# Patient Record
Sex: Female | Born: 1980
Health system: Southern US, Community
[De-identification: ages and names within clinical notes are randomized; demographics above are authoritative.]

## PROBLEM LIST (undated history)

## (undated) ENCOUNTER — Inpatient Hospital Stay (HOSPITAL_COMMUNITY): Payer: Self-pay

## (undated) DIAGNOSIS — O10919 Unspecified pre-existing hypertension complicating pregnancy, unspecified trimester: Secondary | ICD-10-CM

## (undated) DIAGNOSIS — T4145XA Adverse effect of unspecified anesthetic, initial encounter: Secondary | ICD-10-CM

## (undated) DIAGNOSIS — T8859XA Other complications of anesthesia, initial encounter: Secondary | ICD-10-CM

## (undated) DIAGNOSIS — J309 Allergic rhinitis, unspecified: Secondary | ICD-10-CM

## (undated) DIAGNOSIS — D219 Benign neoplasm of connective and other soft tissue, unspecified: Secondary | ICD-10-CM

## (undated) DIAGNOSIS — IMO0002 Reserved for concepts with insufficient information to code with codable children: Secondary | ICD-10-CM

## (undated) DIAGNOSIS — F419 Anxiety disorder, unspecified: Secondary | ICD-10-CM

## (undated) HISTORY — DX: Other complications of anesthesia, initial encounter: T88.59XA

## (undated) HISTORY — DX: Adverse effect of unspecified anesthetic, initial encounter: T41.45XA

## (undated) HISTORY — DX: Benign neoplasm of connective and other soft tissue, unspecified: D21.9

## (undated) HISTORY — DX: Reserved for concepts with insufficient information to code with codable children: IMO0002

## (undated) HISTORY — DX: Allergic rhinitis, unspecified: J30.9

---

## 2002-07-16 HISTORY — PX: KNEE SURGERY: SHX244

## 2012-12-23 DIAGNOSIS — Z6837 Body mass index (BMI) 37.0-37.9, adult: Secondary | ICD-10-CM | POA: Insufficient documentation

## 2014-03-19 ENCOUNTER — Emergency Department (HOSPITAL_COMMUNITY)
Admission: EM | Admit: 2014-03-19 | Discharge: 2014-03-19 | Disposition: A | Discharge status: Home / Self Care | Payer: Managed Care, Other (non HMO) | Attending: Emergency Medicine | Admitting: Emergency Medicine

## 2014-03-19 ENCOUNTER — Emergency Department (HOSPITAL_COMMUNITY): Payer: Managed Care, Other (non HMO)

## 2014-03-19 ENCOUNTER — Encounter (HOSPITAL_COMMUNITY): Payer: Self-pay | Admitting: Emergency Medicine

## 2014-03-19 DIAGNOSIS — R109 Unspecified abdominal pain: Secondary | ICD-10-CM

## 2014-03-19 DIAGNOSIS — Z8719 Personal history of other diseases of the digestive system: Secondary | ICD-10-CM | POA: Insufficient documentation

## 2014-03-19 DIAGNOSIS — R1012 Left upper quadrant pain: Secondary | ICD-10-CM | POA: Insufficient documentation

## 2014-03-19 DIAGNOSIS — Z3202 Encounter for pregnancy test, result negative: Secondary | ICD-10-CM | POA: Insufficient documentation

## 2014-03-19 DIAGNOSIS — Z8659 Personal history of other mental and behavioral disorders: Secondary | ICD-10-CM | POA: Insufficient documentation

## 2014-03-19 DIAGNOSIS — R197 Diarrhea, unspecified: Secondary | ICD-10-CM | POA: Insufficient documentation

## 2014-03-19 DIAGNOSIS — R002 Palpitations: Secondary | ICD-10-CM | POA: Insufficient documentation

## 2014-03-19 HISTORY — DX: Anxiety disorder, unspecified: F41.9

## 2014-03-19 LAB — CBC WITH DIFFERENTIAL/PLATELET
BASOS ABS: 0 10*3/uL (ref 0.0–0.1)
Basophils Relative: 0 % (ref 0–1)
Eosinophils Absolute: 0.1 10*3/uL (ref 0.0–0.7)
Eosinophils Relative: 1 % (ref 0–5)
HCT: 34.9 % — ABNORMAL LOW (ref 36.0–46.0)
Hemoglobin: 11.4 g/dL — ABNORMAL LOW (ref 12.0–15.0)
LYMPHS PCT: 32 % (ref 12–46)
Lymphs Abs: 2.2 10*3/uL (ref 0.7–4.0)
MCH: 27 pg (ref 26.0–34.0)
MCHC: 32.7 g/dL (ref 30.0–36.0)
MCV: 82.7 fL (ref 78.0–100.0)
MONOS PCT: 8 % (ref 3–12)
Monocytes Absolute: 0.5 10*3/uL (ref 0.1–1.0)
NEUTROS ABS: 4 10*3/uL (ref 1.7–7.7)
Neutrophils Relative %: 59 % (ref 43–77)
Platelets: 329 10*3/uL (ref 150–400)
RBC: 4.22 MIL/uL (ref 3.87–5.11)
RDW: 14.2 % (ref 11.5–15.5)
WBC: 6.9 10*3/uL (ref 4.0–10.5)

## 2014-03-19 LAB — I-STAT TROPONIN, ED: Troponin i, poc: 0 ng/mL (ref 0.00–0.08)

## 2014-03-19 LAB — COMPREHENSIVE METABOLIC PANEL
ALT: 15 U/L (ref 0–35)
AST: 16 U/L (ref 0–37)
Albumin: 3.4 g/dL — ABNORMAL LOW (ref 3.5–5.2)
Alkaline Phosphatase: 81 U/L (ref 39–117)
BILIRUBIN TOTAL: 0.3 mg/dL (ref 0.3–1.2)
BUN: 5 mg/dL — ABNORMAL LOW (ref 6–23)
CHLORIDE: 103 meq/L (ref 96–112)
CO2: 23 meq/L (ref 19–32)
CREATININE: 0.65 mg/dL (ref 0.50–1.10)
Calcium: 9.2 mg/dL (ref 8.4–10.5)
GFR calc Af Amer: >90 mL/min (ref >90)
Glucose, Bld: 106 mg/dL — ABNORMAL HIGH (ref 70–99)
Potassium: 3.5 mEq/L — ABNORMAL LOW (ref 3.7–5.3)
Sodium: 138 mEq/L (ref 137–147)
Total Protein: 7.3 g/dL (ref 6.0–8.3)

## 2014-03-19 LAB — URINALYSIS, ROUTINE W REFLEX MICROSCOPIC
BILIRUBIN URINE: NEGATIVE
GLUCOSE, UA: NEGATIVE mg/dL
Hgb urine dipstick: NEGATIVE
KETONES UR: NEGATIVE mg/dL
Nitrite: NEGATIVE
PROTEIN: NEGATIVE mg/dL
Specific Gravity, Urine: 1.02 (ref 1.005–1.030)
Urobilinogen, UA: 0.2 mg/dL (ref 0.0–1.0)
pH: 5.5 (ref 5.0–8.0)

## 2014-03-19 LAB — LIPASE, BLOOD: Lipase: 18 U/L (ref 11–59)

## 2014-03-19 LAB — POC URINE PREG, ED: Preg Test, Ur: NEGATIVE

## 2014-03-19 LAB — URINE MICROSCOPIC-ADD ON

## 2014-03-19 MED ORDER — FAMOTIDINE 20 MG PO TABS
20.0000 mg | ORAL_TABLET | Freq: Once | ORAL | Status: AC
  Administered 2014-03-19: 20 mg via ORAL
  Filled 2014-03-19: qty 1

## 2014-03-19 NOTE — Discharge Instructions (Signed)
Start taking pepcid daily for the next 2 weeks.  May then go to as-needed basis if desired. May notice that spicy and acidic foods (tomatoes, peppers, lemon, lime, citrus, etc) will irritate your stomach and/or cause more heartburn more than other foods. Follow up with your primary care physician. Return to the ED for new or worsening symptoms.

## 2014-03-19 NOTE — ED Notes (Signed)
Pt states that on Thursday at 0100 in the morning, she got heartburn and began having lt sided abd pain.  Felt better.  Friday at noon, began having stabbing pain in LUQ and heartburn with it.  States that she has been taking ibuprofen which helped.  States that it hurts worse when she takes a deep breath.  Her mom suggested taking a laxative and has since been having diarrhea.

## 2014-03-19 NOTE — ED Provider Notes (Signed)
CSN: [id number]     Arrival date & time 03/19/14  9528 History   First MD Initiated Contact with Patient 03/19/14 [phone]     Chief Complaint  Patient presents with  . Abdominal Pain     (Consider location/radiation/quality/duration/timing/severity/associated sxs/prior Treatment) The history is provided by the patient and medical records.   This is a 33 y.o. F with PMH significant for anxiety presenting to the ED for LUQ pain, onset 4 days ago.  Pt states sx started out as heartburn and developed into left-sided abdominal pain.  States symptoms initially improved with Motrin, however have worsened since then. Patient states her abdomen is not tender, but increased pain with deep breathing and moving.  Denies any chest or abdominal trauma.  Denies shortness of breath. No nausea or vomiting.  Pt states her mom thought she might be constipated so she took a laxative after which she developed non-bloody diarrhea.  Pt has hx of GERD, not been on meds in several years.  No prior hx of gastric ulcers.   No urinary sx.  No fevers or chills.  VS stable on arrival.  Past Medical History  Diagnosis Date  . Anxiety    Past Surgical History  Procedure Laterality Date  . Knee surgery     History reviewed. No pertinent family history. History  Substance Use Topics  . Smoking status: Never Smoker   . Smokeless tobacco: Not on file  . Alcohol Use: No   OB History   Grav Para Term Preterm Abortions TAB SAB Ect Mult Living                 Review of Systems  Gastrointestinal: Positive for abdominal pain and diarrhea.       GERD  All other systems reviewed and are negative.   Allergies  Review of patient's allergies indicates no known allergies.  Home Medications  No current outpatient prescriptions on file. BP 128/94  Pulse 111  Temp(Src) 99.5 F (37.5 C) (Oral)  Resp 16  SpO2 100%  LMP 03/03/2014  Physical Exam  Nursing note and vitals reviewed. Constitutional: She is oriented to  person, place, and time. She appears well-developed and well-nourished. No distress.  HENT:  Head: Normocephalic and atraumatic.  Mouth/Throat: Oropharynx is clear and moist.  Eyes: Conjunctivae and EOM are normal. Pupils are equal, round, and reactive to light.  Neck: Normal range of motion. Neck supple.  Cardiovascular: Normal rate, regular rhythm and normal heart sounds.   Pulmonary/Chest: Breath sounds normal. No respiratory distress. She has no decreased breath sounds. She has no wheezes. She has no rhonchi.  Chest wall non-tender  Abdominal: Soft. Bowel sounds are normal. There is no tenderness. There is no guarding and no CVA tenderness.  Abdomen soft, non-tender, no peritoneal signs  Musculoskeletal: Normal range of motion.  Neurological: She is alert and oriented to person, place, and time.  Skin: Skin is warm and dry. She is not diaphoretic.  Psychiatric: She has a normal mood and affect.    ED Course  Procedures (including critical care time) Labs Review Labs Reviewed  CBC WITH DIFFERENTIAL - Abnormal; Notable for the following:    Hemoglobin 11.4 (*)    HCT 34.9 (*)    All other components within normal limits  COMPREHENSIVE METABOLIC PANEL - Abnormal; Notable for the following:    Potassium 3.5 (*)    Glucose, Bld 106 (*)    BUN 5 (*)    Albumin 3.4 (*)  All other components within normal limits  URINALYSIS, ROUTINE W REFLEX MICROSCOPIC - Abnormal; Notable for the following:    APPearance CLOUDY (*)    Leukocytes, UA MODERATE (*)    All other components within normal limits  URINE MICROSCOPIC-ADD ON - Abnormal; Notable for the following:    Squamous Epithelial / LPF MANY (*)    Bacteria, UA FEW (*)    All other components within normal limits  LIPASE, BLOOD  POC URINE PREG, ED  I-STAT TROPOININ, ED   Imaging Review Dg Abd Acute W/chest  03/19/2014   CLINICAL DATA:  Left-sided abdominal pain for 2 days.  EXAM: ACUTE ABDOMEN SERIES (ABDOMEN 2 VIEW & CHEST 1  VIEW)  COMPARISON:  None.  FINDINGS: The heart size and mediastinal contours are normal. The lungs are clear. There is no pleural effusion or pneumothorax. No acute osseous findings are identified.  The bowel gas pattern is normal. There is no free intraperitoneal air or suspicious abdominal calcification. The osseous structures appear normal.  IMPRESSION: No active cardiopulmonary or abdominal process.   Electronically Signed   By: Camie Patience M.D.   On: 03/19/2014 10:58     EKG Interpretation None      MDM   Final diagnoses:  Abdominal pain   Labs as above, largely unremarkable.  U/a appears contaminated.  Acute abd series negative for acute processes, no free air.  Pt given dose of pepcid with improvement of symptoms.  Repeat abdominal exam remains benign without peritoneal signs.  Patient remains afebrile and overall nontoxic appearing.  I doubt acute/surgical abdomen at this time including, but not limited to, SBO, pancreatitis, cholecystitis, appendicitis, perforated ulcer, bowel perforation, etc at this time. Suspicion that sx are GERD related.  Advised to re-start home pepcid.  FU with PCP.  Discussed plan with pt, she acknowledged understanding and agreed with plan of care.  Larene Pickett, PA-C 03/19/14 1428

## 2014-03-19 NOTE — ED Provider Notes (Signed)
Medical screening examination/treatment/procedure(s) were performed by non-physician practitioner and as supervising physician I was immediately available for consultation/collaboration.   EKG Interpretation None        Ezequiel Essex, MD 03/19/14 [phone]

## 2014-03-19 NOTE — ED Notes (Signed)
PA at bedside Pt alert and oriented x4. Respirations even and unlabored, bilateral symmetrical rise and fall of chest. Skin warm and dry. In no acute distress. Denies needs.   

## 2014-05-17 DIAGNOSIS — K219 Gastro-esophageal reflux disease without esophagitis: Secondary | ICD-10-CM | POA: Insufficient documentation

## 2014-06-07 ENCOUNTER — Ambulatory Visit (INDEPENDENT_AMBULATORY_CARE_PROVIDER_SITE_OTHER): Payer: BC Managed Care – PPO | Admitting: Gynecology

## 2014-06-07 ENCOUNTER — Encounter: Payer: Self-pay | Admitting: Gynecology

## 2014-06-07 VITALS — BP 126/90 | Resp 16 | Ht 68.0 in | Wt 274.0 lb

## 2014-06-07 DIAGNOSIS — Z0000 Encounter for general adult medical examination without abnormal findings: Secondary | ICD-10-CM

## 2014-06-07 DIAGNOSIS — Z113 Encounter for screening for infections with a predominantly sexual mode of transmission: Secondary | ICD-10-CM

## 2014-06-07 DIAGNOSIS — Z3009 Encounter for other general counseling and advice on contraception: Secondary | ICD-10-CM

## 2014-06-07 DIAGNOSIS — D219 Benign neoplasm of connective and other soft tissue, unspecified: Secondary | ICD-10-CM | POA: Insufficient documentation

## 2014-06-07 DIAGNOSIS — Z124 Encounter for screening for malignant neoplasm of cervix: Secondary | ICD-10-CM

## 2014-06-07 DIAGNOSIS — D259 Leiomyoma of uterus, unspecified: Secondary | ICD-10-CM

## 2014-06-07 DIAGNOSIS — Z01419 Encounter for gynecological examination (general) (routine) without abnormal findings: Secondary | ICD-10-CM

## 2014-06-07 LAB — POCT URINALYSIS DIPSTICK
Leukocytes, UA: NEGATIVE
PH UA: 5
Urobilinogen, UA: NEGATIVE

## 2014-06-07 NOTE — Patient Instructions (Signed)
Levonorgestrel intrauterine device (IUD) What is this medicine? LEVONORGESTREL IUD (LEE voe nor jes trel) is a contraceptive (birth control) device. The device is placed inside the uterus by a healthcare professional. It is used to prevent pregnancy and can also be used to treat heavy bleeding that occurs during your period. Depending on the device, it can be used for 3 to 5 years. This medicine may be used for other purposes; ask your health care provider or pharmacist if you have questions. COMMON BRAND NAME(S): Mirena, Skyla What should I tell my health care provider before I take this medicine? They need to know if you have any of these conditions: -abnormal Pap smear -cancer of the breast, uterus, or cervix -diabetes -endometritis -genital or pelvic infection now or in the past -have more than one sexual partner or your partner has more than one partner -heart disease -history of an ectopic or tubal pregnancy -immune system problems -IUD in place -liver disease or tumor -problems with blood clots or take blood-thinners -use intravenous drugs -uterus of unusual shape -vaginal bleeding that has not been explained -an unusual or allergic reaction to levonorgestrel, other hormones, silicone, or polyethylene, medicines, foods, dyes, or preservatives -pregnant or trying to get pregnant -breast-feeding How should I use this medicine? This device is placed inside the uterus by a health care professional. Talk to your pediatrician regarding the use of this medicine in children. Special care may be needed. Overdosage: If you think you have taken too much of this medicine contact a poison control center or emergency room at once. NOTE: This medicine is only for you. Do not share this medicine with others. What if I miss a dose? This does not apply. What may interact with this medicine? Do not take this medicine with any of the following  medications: -amprenavir -bosentan -fosamprenavir This medicine may also interact with the following medications: -aprepitant -barbiturate medicines for inducing sleep or treating seizures -bexarotene -griseofulvin -medicines to treat seizures like carbamazepine, ethotoin, felbamate, oxcarbazepine, phenytoin, topiramate -modafinil -pioglitazone -rifabutin -rifampin -rifapentine -some medicines to treat HIV infection like atazanavir, indinavir, lopinavir, nelfinavir, tipranavir, ritonavir -St. John's wort -warfarin This list may not describe all possible interactions. Give your health care provider a list of all the medicines, herbs, non-prescription drugs, or dietary supplements you use. Also tell them if you smoke, drink alcohol, or use illegal drugs. Some items may interact with your medicine. What should I watch for while using this medicine? Visit your doctor or health care professional for regular check ups. See your doctor if you or your partner has sexual contact with others, becomes HIV positive, or gets a sexual transmitted disease. This product does not protect you against HIV infection (AIDS) or other sexually transmitted diseases. You can check the placement of the IUD yourself by reaching up to the top of your vagina with clean fingers to feel the threads. Do not pull on the threads. It is a good habit to check placement after each menstrual period. Call your doctor right away if you feel more of the IUD than just the threads or if you cannot feel the threads at all. The IUD may come out by itself. You may become pregnant if the device comes out. If you notice that the IUD has come out use a backup birth control method like condoms and call your health care provider. Using tampons will not change the position of the IUD and are okay to use during your period. What side effects may I   notice from receiving this medicine? Side effects that you should report to your doctor or  health care professional as soon as possible: -allergic reactions like skin rash, itching or hives, swelling of the face, lips, or tongue -fever, flu-like symptoms -genital sores -high blood pressure -no menstrual period for 6 weeks during use -pain, swelling, warmth in the leg -pelvic pain or tenderness -severe or sudden headache -signs of pregnancy -stomach cramping -sudden shortness of breath -trouble with balance, talking, or walking -unusual vaginal bleeding, discharge -yellowing of the eyes or skin Side effects that usually do not require medical attention (report to your doctor or health care professional if they continue or are bothersome): -acne -breast pain -change in sex drive or performance -changes in weight -cramping, dizziness, or faintness while the device is being inserted -headache -irregular menstrual bleeding within first 3 to 6 months of use -nausea This list may not describe all possible side effects. Call your doctor for medical advice about side effects. You may report side effects to FDA at 1-800-FDA-1088. Where should I keep my medicine? This does not apply. NOTE: This sheet is a summary. It may not cover all possible information. If you have questions about this medicine, talk to your doctor, pharmacist, or health care provider.  2015, Elsevier/Gold Standard. (2012-01-02 13:54:04)  

## 2014-06-07 NOTE — Progress Notes (Addendum)
33 y.o. Single African American female   G0P0000 here for annual exam. Pt is currently sexually active.  Pt reports cycles are heavy, clots.  Dysmenorrhea occaisionally uses advil.  Pt tried ocp in college but reported nauseated.  Current partner on and off for 53m.  No STD history.    Patient's last menstrual period was 05/27/2014.          Sexually active: yes  The current method of family planning is condoms alll of the time.    Exercising: yes  dance, cardio 4x/wk Last pap:  11/2011- wnl Alcohol: 4 glasses of wine/wk Tobacco: no BSE: no  Hgb: 11.8 ; Urine: Negative     There are no preventive care reminders to display for this patient.  Family History  Problem Relation Age of Onset  . Colon cancer Maternal Grandfather   . Diabetes Mellitus II Mother   . Diabetes Mellitus II Father   . Diabetes Maternal Grandmother   . Diabetes Paternal Grandmother   . Hypertension Mother   . Hypertension Father   . Thyroid disease Mother     There are no active problems to display for this patient.   Past Medical History  Diagnosis Date  . Anxiety   . Fibroid     Past Surgical History  Procedure Laterality Date  . Knee surgery  2002    Allergies: Review of patient's allergies indicates no known allergies.  Current Outpatient Prescriptions  Medication Sig Dispense Refill  . Multiple Vitamins-Minerals (MULTIVITAMIN WITH MINERALS) tablet Take 1 tablet by mouth daily.      Marland Kitchen omeprazole (PRILOSEC) 40 MG capsule Take 40 mg by mouth daily.       No current facility-administered medications for this visit.    ROS: Pertinent items are noted in HPI.  Exam:    BP 126/90  Resp 16  Ht 5\' 8"  (1.727 m)  Wt 274 lb (124.286 kg)  BMI 41.67 kg/m2  LMP 05/27/2014 Weight change: @WEIGHTCHANGE @ Last 3 height recordings:  Ht Readings from Last 3 Encounters:  06/07/14 5\' 8"  (1.727 m)   General appearance: alert, cooperative and appears stated age Head: Normocephalic, without obvious  abnormality, atraumatic Neck: no adenopathy, no carotid bruit, no JVD, supple, symmetrical, trachea midline and thyroid not enlarged, symmetric, no tenderness/mass/nodules Lungs: clear to auscultation bilaterally Breasts: normal appearance, no masses or tenderness Heart: regular rate and rhythm, S1, S2 normal, no murmur, click, rub or gallop Abdomen: soft, non-tender; bowel sounds normal; no masses,  no organomegaly Extremities: extremities normal, atraumatic, no cyanosis or edema Skin: Skin color, texture, turgor normal. No rashes or lesions Lymph nodes: Cervical, supraclavicular, and axillary nodes normal. no inguinal nodes palpated Neurologic: Grossly normal   Pelvic: External genitalia:  no lesions              Urethra: normal appearing urethra with no masses, tenderness or lesions              Bartholins and Skenes: Bartholin's, Urethra, Skene's normal                 Vagina: normal appearing vagina with normal color and discharge, no lesions              Cervix: normal appearance              Pap taken: yes        Bimanual Exam:  Uterus:  enlarged to 10 week's size, irregular  Adnexa:    Limited by habitus                                      Rectovaginal: Confirms                                      Anus:  normal sphincter tone, no lesions    1. Routine gynecological examination   counseled on breast self exam, condom use, risks and benefits of contraceptives, diet and exercise return annually or prn  2. Laboratory examination ordered as part of a routine general medical examination  - POCT Urinalysis Dipstick - Hemoglobin, fingerstick  3. Uterine leiomyoma, unspecified location  - US Transvaginal Non-OB; Future  4. Screening for cervical cancer Guideline changes reviewed - Pap Test with HP (IPS)  5. Screen for STD (sexually transmitted disease)  - N. gonorrhoea and Chlamydia by PCR (IPS)  6. General counseling and advice for  contraceptive management Contraceptive options reviewed.  Pt reports nausea with ocp, suggested either IUD or nexplanon, pt most interested in IUD, information provided.  Will get PUS before placement due to irregular uterus and exam limited by habitus - IUD Insertion; Future   An After Visit Summary was printed and given to the patient.

## 2014-06-08 LAB — HEMOGLOBIN, FINGERSTICK: Hemoglobin, fingerstick: 11.8 g/dL — ABNORMAL LOW (ref 12.0–16.0)

## 2014-06-09 LAB — IPS PAP TEST WITH HPV

## 2014-06-09 LAB — IPS N GONORRHOEA AND CHLAMYDIA BY PCR

## 2014-06-10 ENCOUNTER — Other Ambulatory Visit: Payer: Self-pay | Admitting: Gynecology

## 2014-06-10 DIAGNOSIS — IMO0002 Reserved for concepts with insufficient information to code with codable children: Secondary | ICD-10-CM

## 2014-06-13 ENCOUNTER — Telehealth: Payer: Self-pay | Admitting: Gynecology

## 2014-06-13 NOTE — Telephone Encounter (Signed)
Pt returning call

## 2014-06-13 NOTE — Telephone Encounter (Signed)
Left message for patient to call back. Need to go over PUS and IUD insertion

## 2014-06-13 NOTE — Telephone Encounter (Signed)
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

## 2014-06-13 NOTE — Telephone Encounter (Signed)
Call to patient, advised pap showing some abnormal cells, not cancer cells but colpo is recommended. Did not explain HPV result, per protocol, MD to review with patient personally. Explanation of procedure given in detail and multiple questions answered. Patient asking about PU procedure that is recommended as well and if this is related. Advised two different procedures unrelated to each other and neither replaces the other.   PUS was to check uterus due to enlarged uterus on exam and prior to IUD insertion, which patient doesn't even think she wants now. Advised of the two, would do colpo first (due to patient's insurance and high deductible) since cant do both at once. Also discussed that although should not delay for month, it would be reasonable to schedule in August after cycle to allow financlial plan for procedure.Will need to confirm this info with Dr Charlies Constable and call her back.    Dr Charlies Constable, please advise on above. Gabriel Cirri, can you precert colpo and give me info.  I will call her with OOP cost.

## 2014-06-13 NOTE — Telephone Encounter (Signed)
Spoke with patient. Advised that per benefit quote received, she will be responsible for $408.26 for PUS and her IUD insertion will be covered at 100% of allowable. She will have 0 out of pocket obligation of the IUD insertion. Advised that payment is expected at the time of service.   Dr.Lathrop, Patient would like to know if there is an alternative procedure that can be performed in place of the PUS? Thank you, Gabriel Cirri

## 2014-06-14 NOTE — Telephone Encounter (Signed)
Defer PUS at this time and start with colpo only, we can discuss further need for evaluation at colpo visit

## 2014-06-14 NOTE — Telephone Encounter (Signed)
PR: $359.45//she has a Deductible: $2700 and has only met $4.35. Once met, plan will pay at 100% of allowable.

## 2014-06-14 NOTE — Telephone Encounter (Signed)
Call to patient approximately 11am and notified of cost for colpo.  Patient states she has called around and found facilities [facility])that will do PUS for less money and with payment plan options.  Advised that we have used [facility] and the hospital both of which have payment arrangements, but she needs to know that this quoted rate is for scan only, would not include charge from office for MD consult. Patient states she has talked directly  with [employer] about payement and has some questions. [employer] told her they prefer offices bill them first and that we cannot deny care. Advised that we are not denying care, but our office policy is separate from [facility] and advised I will have administrator call her regarding this.  We can certainly refer her to more cost effective option, even clinic at [facility], although with private insurance, clinic may not be able to help with cost.   Will check with Dr lathrop to see if recommendations for colpo first are agreeable.    Dr Charlies Constable please advise.  Thayer Ohm, practice administrator  notified and will call patient.

## 2014-06-15 NOTE — Telephone Encounter (Signed)
Patient calling to schedule colpo after speaking with Engineer, building services.  Advise Dr Charlies Constable agrees to plan to schedule colpo first and then discuss PUS.  Menses due week of 7-7. Colpo scheduled for 06-29-14 at 1000. Instructed to take Motrin 800 mg one hour prior with food.

## 2014-06-29 ENCOUNTER — Ambulatory Visit (INDEPENDENT_AMBULATORY_CARE_PROVIDER_SITE_OTHER): Payer: BC Managed Care – PPO | Admitting: Gynecology

## 2014-06-29 VITALS — BP 112/72 | Resp 16 | Ht 68.0 in | Wt 270.0 lb

## 2014-06-29 DIAGNOSIS — IMO0002 Reserved for concepts with insufficient information to code with codable children: Secondary | ICD-10-CM

## 2014-06-29 DIAGNOSIS — R6889 Other general symptoms and signs: Secondary | ICD-10-CM

## 2014-06-29 DIAGNOSIS — B977 Papillomavirus as the cause of diseases classified elsewhere: Secondary | ICD-10-CM

## 2014-06-29 NOTE — Patient Instructions (Signed)

## 2014-06-29 NOTE — Progress Notes (Signed)
Patient ID: Beverly Tate, female   DOB: Oct 28, 1981, 33 y.o.   MRN: [id number]  Chief Complaint  Patient presents with  . 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 female.   HPI  Indications: Pap smear on June 2015 showed: normal PAP with +HPV. Previous colposcopy: none. Prior cervical treatment: no treatment.Procedure explained and patient's questions were invited and answered.  Consent form signed.    Role of HPV in genesis of SIL discussed with patient, and questions answered.     Past Medical History  Diagnosis Date  . Anxiety   . Fibroid     Past Surgical History  Procedure Laterality Date  . Knee surgery  2002    Family History  Problem Relation Age of Onset  . Colon cancer Maternal Grandfather   . Diabetes Mellitus II Mother   . Diabetes Mellitus II Father   . Diabetes Maternal Grandmother   . Diabetes Paternal Grandmother   . Hypertension Mother   . Hypertension Father   . Thyroid disease Mother   . Anemia Mother   . Anemia Sister   . Sarcoidosis Sister     Social History History  Substance Use Topics  . Smoking status: Never Smoker   . Smokeless tobacco: Never Used  . Alcohol Use: 2.4 oz/week    4 Glasses of wine per week    No Known Allergies  Current Outpatient Prescriptions  Medication Sig Dispense Refill  . Multiple Vitamins-Minerals (MULTIVITAMIN WITH MINERALS) tablet Take 1 tablet by mouth daily.      Marland Kitchen omeprazole (PRILOSEC) 40 MG capsule Take 40 mg by mouth daily.       No current facility-administered medications for this visit.    Review of Systems Review of Systems  Last menstrual period 05/27/2014.  Physical Exam Physical Exam  Nursing note and vitals reviewed. Constitutional: She appears well-developed and well-nourished.  Genitourinary: Vagina normal.      Data Reviewed   Assessment    Procedure Details  The risks and benefits of the procedure and  Written informed consent obtained.  Marland KitchenSpeculum inserted atraumatically and cervix visualized.  3% acetic acid applied.  Cervix examined using 3.75 and 7.5 and 15   X magnification and green filter.    Gross appearance:normal  Squamocolumnar junction seen in entirety: yes   no mosaicism, no abnormal vasculature, acetowhite lesion(s) noted at 4 o'clock, punctation noted at 4 o'clock and HPV changes noted at 4 o'clock  cervix swabbed with Lugol's solution, absent staining at 4, endocervical speculum placed, SCJ visualized 360 degrees without lesions, cervical biopsies taken at 12,4 o'clock, specimen labelled and sent to pathology and hemostasis achieved with Monsel's solution  Extent of lesion entirely seen: yes    Specimens: 12, 4  Complications: none.     Plan    Specimens labelled and sent to Pathology. Triage based on results      [place] 06/29/2014, 10:17 AM    Patient tolerated procedure well.    Post biopsy instructions and AVS given to patient.

## 2014-07-01 LAB — IPS OTHER TISSUE BIOPSY

## 2014-10-28 ENCOUNTER — Telehealth: Payer: Self-pay | Admitting: Gynecology

## 2014-10-28 DIAGNOSIS — D259 Leiomyoma of uterus, unspecified: Secondary | ICD-10-CM

## 2014-10-28 NOTE — Telephone Encounter (Signed)
Pt may want to wait until beginning of January if any goes to deductible.  Gabriel Cirri will call pt.  D/W Lamont Snowball possibility of pt going to outpt facility for scanning.  Pt was offered this and declined.  Agree with plan.

## 2014-10-28 NOTE — Telephone Encounter (Signed)
Patient calling to schedule an ultrasound Dr. Charlies Constable recommended to the patient. Insurance updated in the system but no card scanned yet. Patient is aware Dr. Charlies Constable is no longer with our office. She wants to reschedule her [facility] but wants to wait to see who will be doing her ultrasound.

## 2014-10-28 NOTE — Telephone Encounter (Signed)
Call to patient. Could not schedule previously ordered PUS due to OOP cost. Has new [facility] insurance and would like to schedule. PUS ordered 05-2014 by Dr Charlies Constable due to enlarged uterus on pelvic exam at [facility]. Denies new problems but is anxious to proceed because would like to get IUD. Due to new job and inability to get off work, first available time that meets schedule requirements is 12-15-14 at 4pm. Appointment scheduled. Will check insurance benefits for PUS as well as IUD coverage and notify patient.  Routing to Dr Sabra Heck for review. Agree ?

## 2014-10-31 ENCOUNTER — Encounter: Payer: Self-pay | Admitting: Family

## 2014-10-31 ENCOUNTER — Ambulatory Visit (HOSPITAL_BASED_OUTPATIENT_CLINIC_OR_DEPARTMENT_OTHER)
Admission: RE | Admit: 2014-10-31 | Discharge: 2014-10-31 | Disposition: A | Discharge status: Home / Self Care | Payer: 59 | Source: Ambulatory Visit | Attending: Family | Admitting: Family

## 2014-10-31 ENCOUNTER — Ambulatory Visit (INDEPENDENT_AMBULATORY_CARE_PROVIDER_SITE_OTHER): Payer: 59 | Admitting: Family

## 2014-10-31 VITALS — BP 130/80 | HR 81 | Temp 98.5°F | Resp 16 | Ht 68.5 in | Wt 279.4 lb

## 2014-10-31 DIAGNOSIS — F42 Obsessive-compulsive disorder: Secondary | ICD-10-CM

## 2014-10-31 DIAGNOSIS — M25471 Effusion, right ankle: Secondary | ICD-10-CM | POA: Insufficient documentation

## 2014-10-31 DIAGNOSIS — M25571 Pain in right ankle and joints of right foot: Secondary | ICD-10-CM | POA: Insufficient documentation

## 2014-10-31 DIAGNOSIS — F429 Obsessive-compulsive disorder, unspecified: Secondary | ICD-10-CM

## 2014-10-31 DIAGNOSIS — Y939 Activity, unspecified: Secondary | ICD-10-CM | POA: Diagnosis not present

## 2014-10-31 DIAGNOSIS — F419 Anxiety disorder, unspecified: Secondary | ICD-10-CM

## 2014-10-31 DIAGNOSIS — X58XXXA Exposure to other specified factors, initial encounter: Secondary | ICD-10-CM | POA: Insufficient documentation

## 2014-10-31 DIAGNOSIS — M7731 Calcaneal spur, right foot: Secondary | ICD-10-CM | POA: Diagnosis not present

## 2014-10-31 DIAGNOSIS — J309 Allergic rhinitis, unspecified: Secondary | ICD-10-CM

## 2014-10-31 DIAGNOSIS — S93409A Sprain of unspecified ligament of unspecified ankle, initial encounter: Secondary | ICD-10-CM

## 2014-10-31 NOTE — Telephone Encounter (Signed)
Spoke with patient. Advised that per benefit quote received, she will be responsible to pay $82.13 when she comes in for PUS. Patient agreeable.

## 2014-10-31 NOTE — Progress Notes (Signed)
Subjective:    Patient ID: Beverly Tate, female    DOB: 1981/11/05, 33 y.o.   MRN: [id number]  HPI  Ms. Beverly Tate is a 33 yr old female who presents today to establish care. She has two concerns:  1) Post nasal drip/cough x 2 weeks.  Cough is productive. Reports that it was "really bad" at the beginning. Took mucinex and other otc cough/cold prep. Initially she had thick sputum.  Some improvement, but cough persists. She reports bad allergies.  Has not been taking any antihistamines.  Denies sinus pain/pressure besides her chronic allergy symptoms. She reports + hx of deviated septum.  2) Ankle injury- reports twisting right ankle 3 weeks ago- has applied ice and taken ibuprofen without improvement in swelling. Reports that she stepped on an acorn and rolled her ankle over.  Swells as the day goes on.  Reports ROM is limited, + pain with plantar flexion.    3) pmhx is significant for anxiety/OCD. Reports that she has been on luvox in the past (was on x 18 months). Has been off x 1 year.  Reports that she is feeling well off of medication. Some anxiety around time of her period.  Reports no OCD symptoms. Reports that she would have obsessive thoughts like "did I turn the stove off."    Reports hx of H. Pylori April 2015, completed triple therapy and reports symptoms resolved.    Review of Systems  Constitutional:       Has gained weight since she moved from [place].  Reports that she has been eating poorly and stopped exercising.   HENT: Positive for postnasal drip and rhinorrhea. Negative for hearing loss.   Eyes: Negative for visual disturbance.  Respiratory: Positive for cough. Negative for shortness of breath.   Gastrointestinal: Negative for diarrhea and constipation.  Genitourinary: Negative for dysuria and frequency.  Musculoskeletal:       See hpi  Neurological: Negative for headaches.  Hematological: Negative for adenopathy.  Psychiatric/Behavioral:       See hpi   Past Medical  History  Diagnosis Date  . Anxiety   . Fibroid     History   Social History  . Marital Status: Single    Spouse Name: N/A    Number of Children: N/A  . Years of Education: N/A   Occupational History  . Not on file.   Social History Main Topics  . Smoking status: Former Smoker -- 1.00 packs/day for 5 years    Types: Cigarettes  . Smokeless tobacco: Never Used  . Alcohol Use: 0.6 - 1.2 oz/week    1-2 Not specified per week  . Drug Use: No  . Sexual Activity:    Partners: Male    Birth Control/ Protection: Condom   Other Topics Concern  . 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 Knee surgery Left 07/2002    ACL repair    Family History  Problem Relation Age of Onset  . Colon cancer Maternal Grandfather   . Diabetes Mellitus II Mother   . Hypertension Mother   . Thyroid disease Mother   . Anemia Mother   . Diabetes Mellitus II Father   . Hypertension Father   .  Diabetes Maternal Grandmother   . Diabetes Paternal Grandmother   . Anemia Sister   . Sarcoidosis Sister     Allergies  Allergen Reactions  . Sulfamethoxazole-Trimethoprim Nausea Only    Current Outpatient Prescriptions on File Prior to Visit  Medication Sig Dispense Refill  . Multiple Vitamins-Minerals (MULTIVITAMIN WITH MINERALS) tablet Take 1 tablet by mouth daily.     No current facility-administered medications on file prior to visit.    BP 130/80 mmHg  Pulse 81  Temp(Src) 98.5 F (36.9 C) (Oral)  Resp 16  Ht 5' 8.5" (1.74 m)  Wt 279 lb 6.4 oz (126.735 kg)  BMI 41.86 kg/m2  SpO2 100%  LMP 10/23/2014       Objective:   Physical Exam  Constitutional: She is oriented to person, place, and time. She appears well-developed and well-nourished. No distress.    HENT:  Head: Normocephalic and atraumatic.  Cardiovascular: Normal rate and regular rhythm.   No murmur heard. Pulmonary/Chest: Effort normal and breath sounds normal. No respiratory distress. She has no wheezes. She has no rales. She exhibits no tenderness.  Musculoskeletal: She exhibits no edema.  Neurological: She is alert and oriented to person, place, and time.  Psychiatric: She has a normal mood and affect. Her behavior is normal. Judgment and thought content normal.          Assessment & Plan:

## 2014-10-31 NOTE — Patient Instructions (Signed)
Please complete x ray on the first floor. Restart claritin and flonase. Call symptoms worsen or if not improved in 1 week. Schedule a complete physical at the front desk. Welcome to [facility]!

## 2014-11-01 ENCOUNTER — Encounter: Payer: Self-pay | Admitting: Family

## 2014-11-01 NOTE — Addendum Note (Signed)
Addended by: Michele Mcalpine on: 11/01/2014 11:18 AM   Modules accepted: Orders

## 2014-11-06 DIAGNOSIS — S93409A Sprain of unspecified ligament of unspecified ankle, initial encounter: Secondary | ICD-10-CM | POA: Insufficient documentation

## 2014-11-06 DIAGNOSIS — F429 Obsessive-compulsive disorder, unspecified: Secondary | ICD-10-CM | POA: Insufficient documentation

## 2014-11-06 DIAGNOSIS — J309 Allergic rhinitis, unspecified: Secondary | ICD-10-CM | POA: Insufficient documentation

## 2014-11-06 DIAGNOSIS — F419 Anxiety disorder, unspecified: Secondary | ICD-10-CM | POA: Insufficient documentation

## 2014-11-06 NOTE — Assessment & Plan Note (Signed)
Stable off meds, monitor.

## 2014-11-06 NOTE — Assessment & Plan Note (Signed)
Symptoms most consistent with allergic rhinitis.  Restart claritin and flonase. Call symptoms worsen or if not improved in 1 week.

## 2014-11-06 NOTE — Assessment & Plan Note (Signed)
Xray is negative for fracture. Symptoms most consistent with fracture.  Pt to call if symptoms worsen, or if symptoms do not improve.

## 2014-11-06 NOTE — Assessment & Plan Note (Signed)
Stable off meds.  Monitor.  

## 2014-11-16 ENCOUNTER — Telehealth: Payer: Self-pay | Admitting: Gynecology

## 2014-11-16 NOTE — Telephone Encounter (Signed)
LMTCB about canceled appointment °

## 2014-11-17 ENCOUNTER — Encounter: Payer: Self-pay | Admitting: Family

## 2014-11-17 MED ORDER — AMOXICILLIN-POT CLAVULANATE 875-125 MG PO TABS: 1.0000 | ORAL_TABLET | Freq: Two times a day (BID) | ORAL | Status: DC

## 2014-11-21 ENCOUNTER — Ambulatory Visit: Payer: 59 | Admitting: Family

## 2014-11-24 ENCOUNTER — Ambulatory Visit: Payer: Managed Care, Other (non HMO) | Admitting: Internal Medicine

## 2014-11-29 ENCOUNTER — Telehealth: Payer: Self-pay | Admitting: Obstetrics & Gynecology

## 2014-11-29 NOTE — Telephone Encounter (Signed)
Patient called in and cancelled her upcoming ultrasound for 12/06/14. She says she has found a new doctor and will not be returning to our office but did not give anymore details.

## 2014-12-05 NOTE — Telephone Encounter (Signed)
Dr Sabra Heck, did you receive this message from [facility]? Please advise if there is anything I need to do?//kn

## 2014-12-05 NOTE — Telephone Encounter (Signed)
No.  Does have PCP in [facility].  OK to close encounter.

## 2014-12-06 ENCOUNTER — Other Ambulatory Visit: Payer: 59

## 2014-12-06 ENCOUNTER — Other Ambulatory Visit: Payer: 59 | Admitting: Obstetrics & Gynecology

## 2014-12-12 ENCOUNTER — Encounter: Payer: Self-pay | Admitting: Family

## 2014-12-12 ENCOUNTER — Other Ambulatory Visit: Payer: Self-pay | Admitting: Family

## 2014-12-12 ENCOUNTER — Telehealth: Payer: Self-pay | Admitting: Family

## 2014-12-12 MED ORDER — FLUCONAZOLE 150 MG PO TABS: 150.0000 mg | ORAL_TABLET | Freq: Once | ORAL | Status: DC

## 2014-12-12 NOTE — Telephone Encounter (Signed)
Caller name: [name] Relation to pt: self Call back number: [phone] Pharmacy:  Reason for call:   Patient states that she does not use the pharmacy at [facility]. Please send all rx's to [facility] out patient pharmacy

## 2014-12-15 ENCOUNTER — Other Ambulatory Visit: Payer: 59 | Admitting: Obstetrics & Gynecology

## 2014-12-15 ENCOUNTER — Other Ambulatory Visit: Payer: 59

## 2014-12-19 ENCOUNTER — Encounter: Payer: Self-pay | Admitting: Internal Medicine

## 2014-12-20 ENCOUNTER — Encounter: Payer: Self-pay | Admitting: Family

## 2014-12-21 ENCOUNTER — Encounter: Payer: Self-pay | Admitting: Gynecology

## 2015-01-05 ENCOUNTER — Encounter: Payer: 59 | Admitting: Family

## 2015-01-05 NOTE — Addendum Note (Signed)
Addended by: Megan Salon on: 01/05/2015 02:43 PM   Modules accepted: Beverly Tate

## 2015-01-05 NOTE — Addendum Note (Signed)
Addended by: Megan Salon on: 01/05/2015 02:39 PM   Modules accepted: Miquel Dunn

## 2015-01-27 ENCOUNTER — Encounter: Payer: 59 | Admitting: Family

## 2015-02-09 ENCOUNTER — Ambulatory Visit: Payer: 59 | Admitting: Internal Medicine

## 2015-02-20 ENCOUNTER — Encounter: Payer: 59 | Admitting: Family

## 2015-03-20 ENCOUNTER — Encounter: Payer: Self-pay | Admitting: Family

## 2015-05-03 ENCOUNTER — Encounter: Payer: Self-pay | Admitting: Physician Assistant

## 2015-05-03 ENCOUNTER — Ambulatory Visit (INDEPENDENT_AMBULATORY_CARE_PROVIDER_SITE_OTHER): Payer: 59 | Admitting: Physician Assistant

## 2015-05-03 VITALS — BP 120/82 | HR 87 | Temp 98.5°F | Ht 68.5 in | Wt 277.6 lb

## 2015-05-03 DIAGNOSIS — J309 Allergic rhinitis, unspecified: Secondary | ICD-10-CM

## 2015-05-03 MED ORDER — FLUTICASONE PROPIONATE 50 MCG/ACT NA SUSP: 2.0000 | Freq: Every day | NASAL | Status: DC

## 2015-05-03 NOTE — Patient Instructions (Signed)
Please increase fluids.  Resume Claritin and Flonase daily.  Keep sinuses flushed out with saline nasal spray.  Follow-up if symptoms are not improving.

## 2015-05-03 NOTE — Assessment & Plan Note (Signed)
Increase fluids.  Resume Claritin daily.  Rx Flonase daily.  Saline nasal spray.  Follow-up PRN.

## 2015-05-03 NOTE — Progress Notes (Signed)
Pre visit review using our clinic review tool, if applicable. No additional management support is needed unless otherwise documented below in the visit note. 

## 2015-05-03 NOTE — Progress Notes (Signed)
Patient presents to clinic today c/o 3 days of nasal congestion, sinus pressure, rhinorrhea.  Denies fever, chills, cough or sore throat. Has significant history of allergies. Has not been taking her Claritin over the past week.   Past Medical History  Diagnosis Date  . Anxiety   . Fibroid   . Allergic rhinitis   . HPV test positive     Current Outpatient Prescriptions on File Prior to Visit  Medication Sig Dispense Refill  . Multiple Vitamins-Minerals (MULTIVITAMIN WITH MINERALS) tablet Take 1 tablet by mouth daily.     No current facility-administered medications on file prior to visit.    Allergies  Allergen Reactions  . Sulfamethoxazole-Trimethoprim Nausea Only    Family History  Problem Relation Age of Onset  . Colon cancer Maternal Grandfather   . Diabetes Mellitus II Mother   . Hypertension Mother   . Thyroid disease Mother   . Anemia Mother   . Diabetes Mellitus II Father   . Hypertension Father   . Diabetes Maternal Grandmother   . Diabetes Paternal Grandmother   . Anemia Sister   . Sarcoidosis Sister     History   Social History  . Marital Status: Single    Spouse Name: N/A  . Number of Children: N/A  . Years of Education: N/A   Social History Main Topics  . Smoking status: Former Smoker -- 1.00 packs/day for 5 years    Types: Cigarettes  . Smokeless tobacco: Never Used  . Alcohol Use: 0.6 - 1.2 oz/week    1-2 Standard drinks or equivalent per week  . Drug Use: No  . Sexual Activity:    Partners: Male    Birth Control/ Protection: Condom   Other Topics Concern  . 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  All other ROS are negative.  Pulse 87  Temp(Src) 98.5 F (36.9 C) (Oral)  Ht 5' 8.5" (1.74 m)  Wt 277 lb 9.6 oz (125.919 kg)  BMI 41.59 kg/m2   SpO2 99%  LMP 04/13/2015  Physical Exam  Constitutional: She is oriented to person, place, and time and well-developed, well-nourished, and in no distress.  HENT:  Head: Normocephalic and atraumatic.  Right Ear: External ear normal.  Left Ear: External ear normal.  Nose: Mucosal edema and rhinorrhea present. Right sinus exhibits no maxillary sinus tenderness and no frontal sinus tenderness. Left sinus exhibits no maxillary sinus tenderness and no frontal sinus tenderness.  Mouth/Throat: Oropharynx is clear and moist. No oropharyngeal exudate.  Eyes: Conjunctivae are normal. Pupils are equal, round, and reactive to light.  Neck: Neck supple.  Cardiovascular: Normal rate, regular rhythm, normal heart sounds and intact distal pulses.   Pulmonary/Chest: Effort normal and breath sounds normal. No respiratory distress. She has no wheezes. She has no rales. She exhibits no tenderness.  Neurological: She is alert and oriented to person, place, and time.  Skin: Skin is warm and dry. No rash noted.  Psychiatric: Affect normal.  Vitals reviewed.   No results found for this or any previous visit (from the past 2160 hour(s)).  Assessment/Plan: Allergic rhinitis Increase fluids.  Resume Claritin daily.  Rx Flonase daily.  Saline nasal spray.  Follow-up PRN.

## 2015-05-05 ENCOUNTER — Encounter: Payer: Self-pay | Admitting: Family

## 2015-05-25 ENCOUNTER — Telehealth: Payer: Self-pay | Admitting: Family

## 2015-05-25 NOTE — Telephone Encounter (Signed)
Pre Visit letter sent  °

## 2015-06-09 ENCOUNTER — Ambulatory Visit: Payer: BC Managed Care – PPO | Admitting: Gynecology

## 2015-06-15 ENCOUNTER — Encounter: Payer: Self-pay | Admitting: Family

## 2015-09-14 ENCOUNTER — Other Ambulatory Visit: Payer: Self-pay | Admitting: Obstetrics and Gynecology

## 2015-09-14 DIAGNOSIS — D259 Leiomyoma of uterus, unspecified: Secondary | ICD-10-CM

## 2015-09-14 DIAGNOSIS — R1031 Right lower quadrant pain: Secondary | ICD-10-CM

## 2015-09-18 ENCOUNTER — Ambulatory Visit
Admission: RE | Admit: 2015-09-18 | Discharge: 2015-09-18 | Disposition: A | Discharge status: Home / Self Care | Payer: BLUE CROSS/BLUE SHIELD | Source: Ambulatory Visit | Attending: Obstetrics and Gynecology | Admitting: Obstetrics and Gynecology

## 2015-09-18 ENCOUNTER — Other Ambulatory Visit: Payer: Self-pay | Admitting: Obstetrics and Gynecology

## 2015-09-18 DIAGNOSIS — D259 Leiomyoma of uterus, unspecified: Secondary | ICD-10-CM

## 2015-09-18 DIAGNOSIS — R1031 Right lower quadrant pain: Secondary | ICD-10-CM

## 2015-09-20 ENCOUNTER — Ambulatory Visit: Payer: 59 | Admitting: Obstetrics and Gynecology

## 2016-01-02 ENCOUNTER — Encounter: Payer: Self-pay | Admitting: Family

## 2016-01-02 ENCOUNTER — Ambulatory Visit (INDEPENDENT_AMBULATORY_CARE_PROVIDER_SITE_OTHER): Payer: BLUE CROSS/BLUE SHIELD | Admitting: Family

## 2016-01-02 VITALS — BP 122/80 | HR 86 | Temp 98.8°F | Resp 16 | Ht 68.0 in | Wt 274.2 lb

## 2016-01-02 DIAGNOSIS — J019 Acute sinusitis, unspecified: Secondary | ICD-10-CM

## 2016-01-02 DIAGNOSIS — Z0000 Encounter for general adult medical examination without abnormal findings: Secondary | ICD-10-CM

## 2016-01-02 LAB — BASIC METABOLIC PANEL
BUN: 6 mg/dL (ref 6–23)
CHLORIDE: 106 meq/L (ref 96–112)
CO2: 27 meq/L (ref 19–32)
CREATININE: 0.67 mg/dL (ref 0.40–1.20)
Calcium: 8.9 mg/dL (ref 8.4–10.5)
GFR: 130.96 mL/min (ref >60.00)
GLUCOSE: 96 mg/dL (ref 70–99)
Potassium: 3.8 mEq/L (ref 3.5–5.1)
SODIUM: 139 meq/L (ref 135–145)

## 2016-01-02 LAB — URINALYSIS, ROUTINE W REFLEX MICROSCOPIC
BILIRUBIN URINE: NEGATIVE
KETONES UR: NEGATIVE
LEUKOCYTES UA: NEGATIVE
NITRITE: NEGATIVE
Specific Gravity, Urine: 1.02 (ref 1.000–1.030)
Total Protein, Urine: NEGATIVE
URINE GLUCOSE: NEGATIVE
UROBILINOGEN UA: 0.2 (ref 0.0–1.0)
pH: 6 (ref 5.0–8.0)

## 2016-01-02 LAB — HEPATIC FUNCTION PANEL
ALT: 13 U/L (ref 0–35)
AST: 16 U/L (ref 0–37)
Albumin: 3.7 g/dL (ref 3.5–5.2)
Alkaline Phosphatase: 74 U/L (ref 39–117)
BILIRUBIN DIRECT: 0 mg/dL (ref 0.0–0.3)
BILIRUBIN TOTAL: 0.5 mg/dL (ref 0.2–1.2)
Total Protein: 7.1 g/dL (ref 6.0–8.3)

## 2016-01-02 LAB — CBC WITH DIFFERENTIAL/PLATELET
BASOS ABS: 0 10*3/uL (ref 0.0–0.1)
Basophils Relative: 0.4 % (ref 0.0–3.0)
EOS ABS: 0.1 10*3/uL (ref 0.0–0.7)
Eosinophils Relative: 1.6 % (ref 0.0–5.0)
HEMATOCRIT: 36.2 % (ref 36.0–46.0)
HEMOGLOBIN: 11.6 g/dL — AB (ref 12.0–15.0)
LYMPHS PCT: 28.9 % (ref 12.0–46.0)
Lymphs Abs: 1.9 10*3/uL (ref 0.7–4.0)
MCHC: 31.9 g/dL (ref 30.0–36.0)
MCV: 85.2 fl (ref 78.0–100.0)
Monocytes Absolute: 0.5 10*3/uL (ref 0.1–1.0)
Monocytes Relative: 7.3 % (ref 3.0–12.0)
NEUTROS ABS: 4.2 10*3/uL (ref 1.4–7.7)
Neutrophils Relative %: 61.8 % (ref 43.0–77.0)
Platelets: 325 10*3/uL (ref 150.0–400.0)
RBC: 4.25 Mil/uL (ref 3.87–5.11)
RDW: 15.5 % (ref 11.5–15.5)
WBC: 6.7 10*3/uL (ref 4.0–10.5)

## 2016-01-02 LAB — LIPID PANEL
CHOLESTEROL: 171 mg/dL (ref 0–200)
HDL: 43.7 mg/dL (ref >39.00)
LDL Cholesterol: 111 mg/dL — ABNORMAL HIGH (ref 0–99)
NONHDL: 127.52
Total CHOL/HDL Ratio: 4
Triglycerides: 81 mg/dL (ref 0.0–149.0)
VLDL: 16.2 mg/dL (ref 0.0–40.0)

## 2016-01-02 LAB — TSH: TSH: 2.73 u[IU]/mL (ref 0.35–4.50)

## 2016-01-02 MED ORDER — AMOXICILLIN-POT CLAVULANATE 875-125 MG PO TABS: 1.0000 | ORAL_TABLET | Freq: Two times a day (BID) | ORAL | Status: DC

## 2016-01-02 MED ORDER — FLUTICASONE PROPIONATE 50 MCG/ACT NA SUSP: 2.0000 | Freq: Every day | NASAL | Status: DC

## 2016-01-02 MED ORDER — FLUCONAZOLE 150 MG PO TABS: ORAL_TABLET | ORAL | Status: DC

## 2016-01-02 NOTE — Progress Notes (Signed)
Subjective:    Patient ID: Beverly Tate, female    DOB: 06-Nov-1981, 35 y.o.   MRN: [id number]  HPI  Ms. Beverly Tate is a 35 yr old female who presents today for cpx.  Immunizations: declines flu shot. Tetanus up to date Diet: reports good diet Wt Readings from Last 3 Encounters:  01/02/16 274 lb 3.2 oz (124.376 kg)  05/03/15 277 lb 9.6 oz (125.919 kg)  10/31/14 279 lb 6.4 oz (126.735 kg)  Exercise: not exercising Pap Smear: last week per gyn Dental:  Up to date Vision: will schedule    Review of Systems  Constitutional: Negative for unexpected weight change.  HENT: Negative for hearing loss.        Recent sinus infection.  12/26 went to urgent care (augmentin) + sinus headaches, + fluid in right ear.  No nasal drainage.  + pressure right frontal and right cheek  Eyes: Negative for visual disturbance.  Respiratory: Negative for cough and shortness of breath.   Cardiovascular: Negative for chest pain.  Gastrointestinal: Negative for nausea, vomiting, diarrhea and constipation.  Genitourinary: Negative for dysuria and frequency.       Heavy menses/fibroids- followed by GYN  Musculoskeletal: Negative for myalgias and arthralgias.  Skin: Negative for rash.  Neurological:       + sinus HA's  Hematological: Negative for adenopathy.  Psychiatric/Behavioral:       Denies depression/anxiety       Past Medical History  Diagnosis Date  . Anxiety   . Fibroid   . Allergic rhinitis   . HPV test positive     Social History   Social History  . Marital Status: Single    Spouse Name: N/A  . Number of Children: N/A  . Years of Education: N/A   Occupational History  . Not on file.   Social History Main Topics  . Smoking status: Former Smoker -- 1.00 packs/day for 5 years    Types: Cigarettes  . Smokeless tobacco: Never Used  . Alcohol Use: 0.6 - 1.2 oz/week    1-2 Standard drinks or equivalent per week  . Drug Use: No  . Sexual Activity:    Partners: Male    Birth Control/  Protection: Condom   Other Topics Concern  . 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 Knee surgery Left 07/2002    ACL repair    Family History  Problem Relation Age of Onset  . Colon cancer Maternal Grandfather   . Diabetes Mellitus II Mother   . Hypertension Mother   . Thyroid disease Mother   . Anemia Mother   . Diabetes Mellitus II Father   . Hypertension Father   . Diabetes Maternal Grandmother   . Diabetes Paternal Grandmother   . Anemia Sister   . Sarcoidosis Sister     Allergies  Allergen Reactions  . Sulfamethoxazole-Trimethoprim Nausea Only    Current Outpatient Prescriptions on File Prior to Visit  Medication Sig Dispense Refill  . Multiple Vitamins-Minerals (MULTIVITAMIN WITH MINERALS) tablet Take 1 tablet by mouth daily.     No current facility-administered medications on file prior to visit.    BP 122/80 mmHg  Pulse 86  Temp(Src) 98.8 F (  37.1 C) (Oral)  Resp 16  Ht 5\' 8"  (1.727 m)  Wt 274 lb 3.2 oz (124.376 kg)  BMI 41.70 kg/m2  SpO2 100%  LMP 12/20/2015    Objective:   Physical Exam Physical Exam  Constitutional: She is oriented to person, place, and time. She appears well-developed and well-nourished. No distress.  HENT:  Head: Normocephalic and atraumatic. R frontal and right maxillary sinus tenderness to palpation Right Ear: Tympanic membrane and ear canal normal.  Left Ear: Tympanic membrane and ear canal normal.  Mouth/Throat: Oropharynx is clear and moist.  Eyes: Pupils are equal, round, and reactive to light. No scleral icterus.  Neck: Normal range of motion. No thyromegaly present.  Cardiovascular: Normal rate and regular rhythm.   No murmur heard. Pulmonary/Chest: Effort normal and breath sounds normal. No  respiratory distress. He has no wheezes. She has no rales. She exhibits no tenderness.  Abdominal: Soft. Bowel sounds are normal. He exhibits no distension and no mass. There is no tenderness. There is no rebound and no guarding.  Musculoskeletal: She exhibits no edema.  Lymphadenopathy:    She has no cervical adenopathy.  Neurological: She is alert and oriented to person, place, and time. She has normal R patellar reflex ( unable to elicit left patellar reflex- (s/p acl repair). She exhibits normal muscle tone. Coordination normal.  Skin: Skin is warm and dry.  Psychiatric: She has a normal mood and affect. Her behavior is normal. Judgment and thought content normal.           Assessment & Plan:          Assessment & Plan:  Sinusitis- pt did have rx for prednisone that she did not take. Advised pt to take prednisone rx.  Restart augmentin x 14 days. If symptoms worsen or do not improve, pt will need CT sinus. Also advised pt to continue claritin and add flonase.  Requests rx for diflucan in case of yeast infeciton

## 2016-01-02 NOTE — Assessment & Plan Note (Signed)
Discussed healthy diet, exercise, weight loss.  Obtain routine labs.  Pap up to date. Declines flu shot.

## 2016-01-02 NOTE — Progress Notes (Signed)
Pre visit review using our clinic review tool, if applicable. No additional management support is needed unless otherwise documented below in the visit note. 

## 2016-01-02 NOTE — Patient Instructions (Signed)
Restart augmentin twice daily for 14 days. Take prednisone rx that you have 30mg  once daily x 6 days Start flonase, continue claritin.   Call if sinus symptoms worsen or if not resolved in 2 weeks.

## 2016-01-05 ENCOUNTER — Telehealth: Payer: Self-pay | Admitting: Obstetrics & Gynecology

## 2016-01-05 NOTE — Telephone Encounter (Signed)
Spoke with patient. Patient states that she is now being seen at [facility] with [place]. States she had her records sent from our office around a year to a year and a half ago. She would like to continue care with their practice.  Routing to provider for final review. Patient agreeable to disposition. Will close encounter.

## 2016-01-05 NOTE — Telephone Encounter (Signed)
Pt with hx of normal pap but +HR HPV 7/15.  Has not had follow up.  Also, never returned for PUS.  Can you call pt and schedule appt.

## 2016-02-01 ENCOUNTER — Encounter: Payer: Self-pay | Admitting: Family

## 2016-02-01 NOTE — Telephone Encounter (Signed)
Last OV 01/02/16 Alprazolam was last filled 03/15/14.   Please advise on a qty, if ok to fill

## 2016-02-07 ENCOUNTER — Ambulatory Visit: Payer: BLUE CROSS/BLUE SHIELD | Admitting: Family

## 2016-02-07 ENCOUNTER — Telehealth: Payer: Self-pay | Admitting: Family

## 2016-02-07 NOTE — Telephone Encounter (Signed)
Pt will not be here today for 2:00pm appt, she is not feeling well today, she apologizes, she left VM today 10:41am, I called and rescheduled her for 02/13/16 8:00am, charge or no charge?

## 2016-02-07 NOTE — Telephone Encounter (Signed)
No charge. 

## 2016-02-13 ENCOUNTER — Telehealth: Payer: Self-pay | Admitting: Family

## 2016-02-13 ENCOUNTER — Ambulatory Visit: Payer: BLUE CROSS/BLUE SHIELD | Admitting: Family

## 2016-02-14 ENCOUNTER — Encounter: Payer: Self-pay | Admitting: Family

## 2016-02-14 NOTE — Telephone Encounter (Signed)
Pt was no show 02/13/16 7:00am for my chart OV, pt has not rescheduled, this is 2nd no show for same reason, last no show 02/07/16, charge or no charge?

## 2016-02-14 NOTE — Telephone Encounter (Signed)
Yes please

## 2016-02-14 NOTE — Telephone Encounter (Signed)
Marked to charge and mailing no show letter °

## 2016-05-02 ENCOUNTER — Telehealth: Payer: Self-pay | Admitting: Family

## 2016-05-02 NOTE — Telephone Encounter (Signed)
Relation to [id number] Call back [phone]   Reason for call:  Received patient Health Screening Form, placed in [facility], advised patient 7 to 10 business day turn around. Patient voice understanding. Please call when ready to pick up

## 2016-05-09 ENCOUNTER — Telehealth: Payer: Self-pay | Admitting: Family

## 2016-05-09 NOTE — Telephone Encounter (Signed)
Reviewed health screening form.  Needs waist measurement when she come to pick up form.

## 2016-05-10 NOTE — Telephone Encounter (Signed)
Melissa--do you still have form?

## 2016-05-10 NOTE — Telephone Encounter (Signed)
I put it in [place] folder.

## 2016-05-14 NOTE — Telephone Encounter (Signed)
Yes, I have form.

## 2016-05-14 NOTE — Telephone Encounter (Signed)
Beverly Tate-- did you get this form?

## 2016-05-16 NOTE — Telephone Encounter (Signed)
Called and informed pt that form is ready for pick up and she needs to provide waist measurement. She verbalized understanding. JG//CMA  Copy sent for scanning.

## 2016-05-16 NOTE — Telephone Encounter (Signed)
See other note

## 2016-06-17 ENCOUNTER — Encounter: Payer: Self-pay | Admitting: Family

## 2016-06-17 ENCOUNTER — Ambulatory Visit (INDEPENDENT_AMBULATORY_CARE_PROVIDER_SITE_OTHER): Payer: BLUE CROSS/BLUE SHIELD | Admitting: Family

## 2016-06-17 ENCOUNTER — Telehealth: Payer: Self-pay | Admitting: Family

## 2016-06-17 VITALS — BP 128/86 | HR 90 | Temp 99.0°F | Resp 16 | Ht 68.0 in | Wt 272.4 lb

## 2016-06-17 DIAGNOSIS — R21 Rash and other nonspecific skin eruption: Secondary | ICD-10-CM

## 2016-06-17 MED ORDER — PREDNISONE 10 MG PO TABS: ORAL_TABLET | ORAL | Status: DC

## 2016-06-17 NOTE — Progress Notes (Signed)
Pre visit review using our clinic review tool, if applicable. No additional management support is needed unless otherwise documented below in the visit note. 

## 2016-06-17 NOTE — Telephone Encounter (Signed)
See f/u note of 06/17/16.

## 2016-06-17 NOTE — Telephone Encounter (Signed)
Patient Name: Beverly Tate  DOB: 1981/07/16    Initial Comment Caller states she woke up with face swelling. Has 2pm appt blocked in case that is what she needs.   Nurse Assessment  Nurse: Raphael Gibney, RN, Vanita Ingles Date/Time (Eastern Time): 06/17/2016 8:24:06 AM  Confirm and document reason for call. If symptomatic, describe symptoms. You must click the next button to save text entered. ---Caller states she woke with her face swollen this am. Her face had some itching last night. Her cheeks are red and warm to the touch. Her cheeks are swollen. Has an appt at 2 pm. No fever.  Has the patient traveled out of the country within the last 30 days? ---No  Does the patient have any new or worsening symptoms? ---Yes  Will a triage be completed? ---Yes  Related visit to physician within the last 2 weeks? ---No  Does the PT have any chronic conditions? (i.e. diabetes, asthma, etc.) ---No  Is the patient pregnant or possibly pregnant? (Ask all females between the ages of 45-55) ---No  Is this a behavioral health or substance abuse call? ---No     Guidelines    Guideline Title Affirmed Question Affirmed Notes  Face Swelling [1] Mild facial swelling (puffiness) AND [2] persists > 3 days    Final Disposition User   See PCP When Office is Open (within 3 days) Raphael Gibney, RN, Vanita Ingles    Disagree/Comply: Leta Baptist

## 2016-06-17 NOTE — Patient Instructions (Addendum)
Schedule follow up with your allergist.  Begin prednisone for rash. Continue allegra or xyzal as needed , you may use benadryl as needed for breakthrough itching.  Call if symptoms worsen or if not improved in 1-2 days. Go to the ER if you develop tongue/lip swelling or shortness of breath

## 2016-06-17 NOTE — Telephone Encounter (Signed)
Called to follow up with patient.  Pt states only cheeks are swollen, warm and red.  No swelling to lips, tongue, or eyes.  Only new changes within the past two days were she tried a new makeup primer yesterday morning, but washed her face last night with two different facial scrubs, and she tinted her own eyebrows Saturday night.  Otherwise, no new foods or anything.  She goes to an allergist, so she's currently taking Singular and Zyrtec.  This morning she also took Human resources officer.  She did mentioned feeling congested and having pressure around her eyes and cheekbones.  She was advised to keep appt as scheduled at 2pm today with Melissa, if symptoms worsen or new symptoms develop to go to the ER or urgent care.  Pt stated understanding and agreed with plan.

## 2016-06-17 NOTE — Progress Notes (Signed)
Subjective:    Patient ID: Beverly Tate, female    DOB: 07-11-1981, 35 y.o.   MRN: [id number]  HPI   Ms. Beverly Tate is a 35 yr old female who presents today with chief complaint of facial swelling.  Reports that facial swelling began this AM.  Reports that she had one similar episode "years ago."  Had some itching on the side of her face last night.   Mildly puritic now.  She began a new Land yesterday She took an allegra this AM.  Denies associated tongue/lip swelling or SOB.      Review of Systems See HPI  Past Medical History  Diagnosis Date  . Anxiety   . Fibroid   . Allergic rhinitis   . HPV test positive      Social History   Social History  . Marital Status: Single    Spouse Name: N/A  . Number of Children: N/A  . Years of Education: N/A   Occupational History  . Not on file.   Social History Main Topics  . Smoking status: Former Smoker -- 1.00 packs/day for 5 years    Types: Cigarettes  . Smokeless tobacco: Never Used  . Alcohol Use: 0.6 - 1.2 oz/week    1-2 Standard drinks or equivalent per week  . Drug Use: No  . Sexual Activity:    Partners: Male    Birth Control/ Protection: Condom   Other Topics Concern  . Not on file   Social History Narrative   Grew up in [place]   Parents moved to [place] to retire   Works for [employer]   Completed bachelors degree   No children, dog, has boyfriend, lives alone       Past Surgical History  Procedure Laterality Date  . Knee surgery Left 07/2002    ACL repair    Family History  Problem Relation Age of Onset  . Colon cancer Maternal Grandfather 78  . Diabetes Mellitus II Mother   . Hypertension Mother   . Thyroid disease Mother   . Anemia Mother   . Diabetes Mellitus II Father   . Hypertension Father   . Diabetes Maternal Grandmother   . Diabetes Paternal Grandmother   . Anemia Sister   . Sarcoidosis Sister     Allergies  Allergen Reactions  . Sulfamethoxazole-Trimethoprim Nausea Only     Current Outpatient Prescriptions on File Prior to Visit  Medication Sig Dispense Refill  . Cholecalciferol (VITAMIN D PO) Take 1 tablet by mouth daily.    . ferrous sulfate 325 (65 FE) MG tablet Take 325 mg by mouth daily with breakfast.    . fluticasone (FLONASE) 50 MCG/ACT nasal spray Place 2 sprays into both nostrils daily. 16 g 6  . Multiple Vitamins-Minerals (MULTIVITAMIN WITH MINERALS) tablet Take 1 tablet by mouth daily.     No current facility-administered medications on file prior to visit.    BP 128/86 mmHg  Pulse 90  Temp(Src) 99 F (37.2 C) (Oral)  Resp 16  Ht 5\' 8"  (1.727 m)  Wt 272 lb 6.4 oz (123.56 kg)  BMI 41.43 kg/m2  SpO2 96%  LMP 06/06/2016       Objective:   Physical Exam  Constitutional: She is oriented to person, place, and time. She appears well-developed and well-nourished.  HENT:  No tongue or lip swelling noted.   Cardiovascular: Normal rate and regular rhythm.   No murmur heard. Pulmonary/Chest: Effort normal and breath sounds normal. No  respiratory distress. She has no wheezes.  Neurological: She is alert and oriented to person, place, and time.  Skin:  + erythema and mild swelling noted of skin on both cheeks  Psychiatric: She has a normal mood and affect. Her behavior is normal. Judgment and thought content normal.          Assessment & Plan:  Skin rash- suspect allergic reaction to new make up primer. Continue antihistamine, will rx with short burst of prednisone. No signs of angioedema. She understands need to proceed to ER if she develops tongue/lip swelling or SOB.

## 2016-06-17 NOTE — Telephone Encounter (Signed)
Noted and agree. 

## 2016-06-17 NOTE — Telephone Encounter (Signed)
Patient called stating that she woke up with her face swollen. Patient requested to see her provider but no appointment available until 2:00 pm. Transferred to [facility] to see if she should wait until that that time. Put HOLD on 2:00 appointment slot until accessed by [facility] and notified [facility]) that the appointment time was being held.

## 2016-06-19 ENCOUNTER — Encounter: Payer: Self-pay | Admitting: Family

## 2016-06-25 ENCOUNTER — Ambulatory Visit: Payer: BLUE CROSS/BLUE SHIELD | Admitting: Family

## 2016-07-04 ENCOUNTER — Ambulatory Visit: Payer: BLUE CROSS/BLUE SHIELD | Admitting: Family

## 2016-08-16 ENCOUNTER — Encounter: Payer: Self-pay | Admitting: Family Medicine

## 2016-08-16 ENCOUNTER — Ambulatory Visit (INDEPENDENT_AMBULATORY_CARE_PROVIDER_SITE_OTHER): Payer: BLUE CROSS/BLUE SHIELD | Admitting: Family Medicine

## 2016-08-16 ENCOUNTER — Ambulatory Visit: Payer: Self-pay | Admitting: Family

## 2016-08-16 VITALS — BP 120/80 | HR 86 | Temp 98.4°F | Ht 68.0 in | Wt 270.2 lb

## 2016-08-16 DIAGNOSIS — R109 Unspecified abdominal pain: Secondary | ICD-10-CM

## 2016-08-16 DIAGNOSIS — F419 Anxiety disorder, unspecified: Secondary | ICD-10-CM

## 2016-08-16 MED ORDER — ALPRAZOLAM 0.25 MG PO TABS: 0.2500 mg | ORAL_TABLET | Freq: Every day | ORAL | 0 refills | Status: AC | PRN

## 2016-08-16 MED ORDER — OMEPRAZOLE 20 MG PO CPDR: 20.0000 mg | DELAYED_RELEASE_CAPSULE | Freq: Every day | ORAL | 3 refills | Status: DC

## 2016-08-16 NOTE — Progress Notes (Signed)
Chief Complaint  Patient presents with  . Abdominal Pain    (L)-sharp ( comes and goes)-chronic    Subjective: Patient is a 35 y.o. female here for abdominal pain.  LUQ, couple months, started 2 years ago. Has a hx of H Pylori that resolved with triple therapy. She thinks things may have worsened once she stopped her medication. No nighttime awakenings, D/V/C/N, lasting around 1-2 minutes once per day. Nothing seems to make it better. Moving makes it worse. Feels superficial to her. Things have been stressful at work, this does exacerbate things.  ROS: Abd: +Abd pain, no N/V/D/C  Family History  Problem Relation Age of Onset  . Colon cancer Maternal Grandfather [id number]  . Diabetes Mellitus II Mother   . Hypertension Mother   . Thyroid disease Mother   . Anemia Mother   . Diabetes Mellitus II Father   . Hypertension Father   . Diabetes Maternal Grandmother   . Diabetes Paternal Grandmother   . Anemia Sister   . Sarcoidosis Sister    Past Medical History:  Diagnosis Date  . Allergic rhinitis   . Anxiety   . Fibroid   . HPV test positive    Allergies  Allergen Reactions  . Sulfamethoxazole-Trimethoprim Nausea Only    Current Outpatient Prescriptions:  .  ACZONE 5 % topical gel, Use as directed., Disp: , Rfl:  .  Cholecalciferol (VITAMIN D PO), Take 1 tablet by mouth daily., Disp: , Rfl:  .  ferrous sulfate 325 (65 FE) MG tablet, Take 325 mg by mouth daily with breakfast., Disp: , Rfl:  .  fluticasone (FLONASE) 50 MCG/ACT nasal spray, Place 2 sprays into both nostrils daily., Disp: 16 g, Rfl: 6 .  levocetirizine (XYZAL) 5 MG tablet, Take 5 mg by mouth daily., Disp: , Rfl: 4 .  montelukast (SINGULAIR) 10 MG tablet, Take 10 mg by mouth daily., Disp: , Rfl: 4 .  Multiple Vitamins-Minerals (MULTIVITAMIN WITH MINERALS) tablet, Take 1 tablet by mouth daily., Disp: , Rfl:  .  tazarotene (AVAGE) 0.1 % cream, Use as directed., Disp: , Rfl:  .  ALPRAZolam (XANAX) 0.25 MG tablet, Take 1  tablet (0.25 mg total) by mouth daily as needed for anxiety., Disp: 10 tablet, Rfl: 0 .  omeprazole (PRILOSEC) 20 MG capsule, Take 1 capsule (20 mg total) by mouth daily., Disp: 30 capsule, Rfl: 3  Objective: BP 120/80 (BP Location: Left Arm, Patient Position: Sitting, Cuff Size: Large)   Pulse 86   Temp 98.4 F (36.9 C) (Oral)   Ht 5\' 8"  (1.727 m)   Wt 270 lb 3.2 oz (122.6 kg)   SpO2 98%   BMI 41.08 kg/m  General: Awake, appears stated age HEENT: MMM, EOMi Heart: RRR, no murmurs Lungs: CTAB, no rales, wheezes or rhonchi. Normal effort Abd: BS+, soft, TTP in LUQ, ND, no masses or organomegaly, Neg Murphy's, Rovsing's, McBurney's, and Carnett's Psych: Age appropriate judgment and insight, normal affect and mood  Assessment and Plan: Abdominal wall pain - Plan: omeprazole (PRILOSEC) 20 MG capsule  Anxiety - Plan: ALPRAZolam (XANAX) 0.25 MG tablet  Orders as above. I am not 100% sure what this is. Part of the history points to [facility] and the medication correlation suggests GI etiology. Try to do stretches and heat application. OK to restart Prilosec. Will give Xanax 10 tabs for 1 mo. If this is not enough, she should go on daily medication. F/u in 4 weeks with POR to recheck anxiety and abdominal pain. The patient voiced  understanding and agreement to the plan.  Crosby Oyster [place]

## 2016-08-16 NOTE — Patient Instructions (Signed)
Prilosec (omeprazole), Zantac (ranitidine) are some OTC anti-acid medications.  Apply heat to the affect area when able for 10-15 minutes at a time. Try to do your stretches 2-3 times daily.

## 2016-08-16 NOTE — Progress Notes (Signed)
Pre visit review using our clinic review tool, if applicable. No additional management support is needed unless otherwise documented below in the visit note. 

## 2016-12-16 NOTE — L&D Delivery Note (Signed)
Delivery Note At 11:58 PM a viable female was delivered via Vaginal, Spontaneous Delivery (Presentation:occiput anterior  ;  ).  APGAR:8 and 9 at 1 and 5 minutes respectively  , ; weight pending .   Placenta status: ,intact 3 vessel cord   Cord:  with the following complications: . None Cord pH: none  Anesthesia:  Spinal  Episiotomy: None Lacerations: 2nd degree Suture Repair: 3.0 vicryl Est. Blood Loss (mL): 400 Given fibroids and increased r/o bleeding. cytotec 1000 mcg placed per rectum   Mom to postpartum.  Baby to Couplet care / Skin to Skin.  Beverly Tate J. 08/26/2017, 12:37 AM

## 2016-12-30 ENCOUNTER — Other Ambulatory Visit: Payer: Self-pay | Admitting: Obstetrics and Gynecology

## 2016-12-30 ENCOUNTER — Other Ambulatory Visit (HOSPITAL_COMMUNITY)
Admission: RE | Admit: 2016-12-30 | Discharge: 2016-12-30 | Disposition: A | Discharge status: Home / Self Care | Payer: BLUE CROSS/BLUE SHIELD | Source: Ambulatory Visit | Attending: Obstetrics and Gynecology | Admitting: Obstetrics and Gynecology

## 2016-12-30 DIAGNOSIS — Z01411 Encounter for gynecological examination (general) (routine) with abnormal findings: Secondary | ICD-10-CM | POA: Diagnosis not present

## 2016-12-30 DIAGNOSIS — Z1151 Encounter for screening for human papillomavirus (HPV): Secondary | ICD-10-CM | POA: Insufficient documentation

## 2016-12-30 DIAGNOSIS — Z113 Encounter for screening for infections with a predominantly sexual mode of transmission: Secondary | ICD-10-CM | POA: Diagnosis present

## 2016-12-30 DIAGNOSIS — Z01419 Encounter for gynecological examination (general) (routine) without abnormal findings: Secondary | ICD-10-CM | POA: Insufficient documentation

## 2017-01-02 LAB — CYTOLOGY - PAP
Chlamydia: NEGATIVE
DIAGNOSIS: NEGATIVE
HPV (WINDOPATH): NOT DETECTED
NEISSERIA GONORRHEA: NEGATIVE

## 2017-01-10 ENCOUNTER — Encounter: Payer: Self-pay | Admitting: Family

## 2017-01-28 LAB — OB RESULTS CONSOLE HEPATITIS B SURFACE ANTIGEN: HEP B S AG: NEGATIVE

## 2017-01-28 LAB — OB RESULTS CONSOLE RUBELLA ANTIBODY, IGM: RUBELLA: IMMUNE

## 2017-01-28 LAB — OB RESULTS CONSOLE ANTIBODY SCREEN: ANTIBODY SCREEN: NEGATIVE

## 2017-01-28 LAB — OB RESULTS CONSOLE RPR: RPR: NONREACTIVE

## 2017-01-28 LAB — OB RESULTS CONSOLE ABO/RH: RH Type: POSITIVE

## 2017-01-28 LAB — OB RESULTS CONSOLE HIV ANTIBODY (ROUTINE TESTING): HIV: NONREACTIVE

## 2017-03-10 DIAGNOSIS — R030 Elevated blood-pressure reading, without diagnosis of hypertension: Secondary | ICD-10-CM | POA: Diagnosis not present

## 2017-04-13 ENCOUNTER — Encounter (HOSPITAL_COMMUNITY): Payer: Self-pay | Admitting: *Deleted

## 2017-04-13 ENCOUNTER — Inpatient Hospital Stay (HOSPITAL_COMMUNITY)
Admission: AD | Admit: 2017-04-13 | Discharge: 2017-04-13 | Disposition: A | Discharge status: Home / Self Care | Payer: 59 | Source: Ambulatory Visit | Attending: Obstetrics and Gynecology | Admitting: Obstetrics and Gynecology

## 2017-04-13 DIAGNOSIS — R1030 Lower abdominal pain, unspecified: Secondary | ICD-10-CM | POA: Diagnosis not present

## 2017-04-13 DIAGNOSIS — R109 Unspecified abdominal pain: Secondary | ICD-10-CM | POA: Diagnosis not present

## 2017-04-13 DIAGNOSIS — O3412 Maternal care for benign tumor of corpus uteri, second trimester: Secondary | ICD-10-CM | POA: Insufficient documentation

## 2017-04-13 DIAGNOSIS — Z832 Family history of diseases of the blood and blood-forming organs and certain disorders involving the immune mechanism: Secondary | ICD-10-CM | POA: Diagnosis not present

## 2017-04-13 DIAGNOSIS — Z882 Allergy status to sulfonamides status: Secondary | ICD-10-CM | POA: Diagnosis not present

## 2017-04-13 DIAGNOSIS — D259 Leiomyoma of uterus, unspecified: Secondary | ICD-10-CM | POA: Diagnosis not present

## 2017-04-13 DIAGNOSIS — Z3A20 20 weeks gestation of pregnancy: Secondary | ICD-10-CM | POA: Diagnosis not present

## 2017-04-13 DIAGNOSIS — O26892 Other specified pregnancy related conditions, second trimester: Secondary | ICD-10-CM

## 2017-04-13 DIAGNOSIS — Z8249 Family history of ischemic heart disease and other diseases of the circulatory system: Secondary | ICD-10-CM | POA: Diagnosis not present

## 2017-04-13 DIAGNOSIS — Z9889 Other specified postprocedural states: Secondary | ICD-10-CM | POA: Diagnosis not present

## 2017-04-13 DIAGNOSIS — Z800 Family history of malignant neoplasm of digestive organs: Secondary | ICD-10-CM | POA: Insufficient documentation

## 2017-04-13 DIAGNOSIS — Z87891 Personal history of nicotine dependence: Secondary | ICD-10-CM | POA: Insufficient documentation

## 2017-04-13 DIAGNOSIS — Z8349 Family history of other endocrine, nutritional and metabolic diseases: Secondary | ICD-10-CM | POA: Diagnosis not present

## 2017-04-13 DIAGNOSIS — Z833 Family history of diabetes mellitus: Secondary | ICD-10-CM | POA: Diagnosis not present

## 2017-04-13 LAB — URINALYSIS, ROUTINE W REFLEX MICROSCOPIC
Bilirubin Urine: NEGATIVE
Glucose, UA: NEGATIVE mg/dL
KETONES UR: NEGATIVE mg/dL
LEUKOCYTES UA: NEGATIVE
NITRITE: NEGATIVE
PH: 6 (ref 5.0–8.0)
PROTEIN: NEGATIVE mg/dL
SPECIFIC GRAVITY, URINE: 1.01 (ref 1.005–1.030)

## 2017-04-13 LAB — URINALYSIS, MICROSCOPIC (REFLEX): Bacteria, UA: NONE SEEN

## 2017-04-13 MED ORDER — IBUPROFEN 800 MG PO TABS
800.0000 mg | ORAL_TABLET | Freq: Four times a day (QID) | ORAL | Status: DC | PRN
  Administered 2017-04-13: 800 mg via ORAL
  Filled 2017-04-13: qty 1

## 2017-04-13 NOTE — MAU Note (Signed)
Chief Complaint:  Abdominal Pain   None    HPI: Beverly Tate is a 36 y.o. G1P0000 at [redacted]w[redacted]d who presents to maternity admissions reporting lower abdominal pain for last 3 days.  Unable to sleep last night.  Pt has a large fibroid noted on [place] and was told will have abdominal pain in pregnancy.  Location: lower abdomin Quality: cramping Severity: 4/10 in pain scale Duration: comes and goes Context: Went away for a day and came back last night Tried Tylenol but did not work. Denies contractions, leakage of fluid or vaginal bleeding. Good fetal movement.   Pregnancy Course:   Past Medical History:  Diagnosis Date  . Allergic rhinitis   . Anxiety   . Fibroid   . HPV test positive    OB History  Gravida Para Term Preterm AB Living  1 0 0 0 0 0  SAB TAB Ectopic Multiple Live Births  0 0 0 0      # Outcome Date GA Lbr Len/2nd Weight Sex Delivery Anes PTL Lv  1 Current              Past Surgical History:  Procedure Laterality Date  . KNEE SURGERY Left 07/2002   ACL repair   Family History  Problem Relation Age of Onset  . Colon cancer Maternal Grandfather 66  . Diabetes Mellitus II Mother   . Hypertension Mother   . Thyroid disease Mother   . Anemia Mother   . Diabetes Mellitus II Father   . Hypertension Father   . Diabetes Maternal Grandmother   . Diabetes Paternal Grandmother   . Anemia Sister   . Sarcoidosis Sister    Social History  Substance Use Topics  . Smoking status: Former Smoker    Packs/day: 1.00    Years: 5.00    Types: Cigarettes  . Smokeless tobacco: Never Used  . Alcohol use 0.6 - 1.2 oz/week    1 - 2 Standard drinks or equivalent per week   Allergies  Allergen Reactions  . Sulfamethoxazole-Trimethoprim Nausea Only   Prescriptions Prior to Admission  Medication Sig Dispense Refill Last Dose  . acetaminophen (TYLENOL) 325 MG tablet Take 650 mg by mouth every 6 (six) hours as needed for mild pain.   04/13/2017 at 0800  . calcium carbonate (TUMS -  DOSED IN MG ELEMENTAL CALCIUM) 500 MG chewable tablet Chew 2 tablets by mouth daily as needed for indigestion or heartburn.   Past Week at Unknown time  . NIFEdipine (PROCARDIA-XL/ADALAT-CC/NIFEDICAL-XL) 30 MG 24 hr tablet Take 30 mg by mouth daily.   04/12/2017 at Unknown time  . Prenatal Vit-Fe Fumarate-FA (PRENATAL MULTIVITAMIN) TABS tablet Take 1 tablet by mouth daily.   04/12/2017 at Unknown time  . fluticasone (FLONASE) 50 MCG/ACT nasal spray Place 2 sprays into both nostrils daily. (Patient not taking: Reported on 04/13/2017) 16 g 6 Not Taking at Unknown time    I have reviewed patient's Past Medical Hx, Surgical Hx, Family Hx, Social Hx, medications and allergies.   ROS:  Review of Systems  Constitutional: Negative.   HENT: Negative.   Eyes: Negative.   Respiratory: Negative.   Cardiovascular: Negative.   Gastrointestinal: Positive for abdominal pain and diarrhea.  Endocrine: Negative.   Genitourinary: Positive for frequency.  Musculoskeletal: Negative.   Skin: Negative.   Allergic/Immunologic: Negative.   Neurological: Negative.   Hematological: Negative.   Psychiatric/Behavioral: The patient is nervous/anxious.    All systems reviewed and negative except as stated above Physical Exam  Patient Vitals for the past 24 hrs:  BP Temp Pulse Resp Height Weight  04/13/17 1144 (!) 137/100 97.8 F (36.6 C) (!) 122 18 5\' 9"  (1.753 m) 123.8 kg (273 lb)   Constitutional: Well-developed, well-nourished female in no acute distress.  Cardiovascular: normal rate Respiratory: normal effort GI: Abd soft, non-tender, gravid appropriate for gestational age. Pos BS x 4 MS: Extremities nontender, no edema, normal ROM Neurologic: Alert and oriented x 4.  GU: Neg CVAT.  Pelvic: NEFG, physiologic discharge, no blood, cervix clean. No CMT Thick mucus discharge noted.   Wet mount negative     FHT:   Labs: Results for orders placed or performed during the hospital encounter of 04/13/17 (from  the past 24 hour(s))  Urinalysis, Routine w reflex microscopic     Status: Abnormal   Collection Time: 04/13/17 11:50 AM  Result Value Ref Range   Color, Urine YELLOW YELLOW   APPearance CLEAR CLEAR   Specific Gravity, Urine 1.010 1.005 - 1.030   pH 6.0 5.0 - 8.0   Glucose, UA NEGATIVE NEGATIVE mg/dL   Hgb urine dipstick TRACE (A) NEGATIVE   Bilirubin Urine NEGATIVE NEGATIVE   Ketones, ur NEGATIVE NEGATIVE mg/dL   Protein, ur NEGATIVE NEGATIVE mg/dL   Nitrite NEGATIVE NEGATIVE   Leukocytes, UA NEGATIVE NEGATIVE  Urinalysis, Microscopic (reflex)     Status: Abnormal   Collection Time: 04/13/17 11:50 AM  Result Value Ref Range   RBC / HPF 0-5 0 - 5 RBC/hpf   WBC, UA 0-5 0 - 5 WBC/hpf   Bacteria, UA NONE SEEN NONE SEEN   Squamous Epithelial / LPF 0-5 (A) NONE SEEN    Imaging:  No results found.  MAU Course: Orders Placed This Encounter  Procedures  . Wet prep, genital  . Urinalysis, Routine w reflex microscopic  . Urinalysis, Microscopic (reflex)  . Lab instructions   Meds ordered this encounter  Medications  . NIFEdipine (PROCARDIA-XL/ADALAT-CC/NIFEDICAL-XL) 30 MG 24 hr tablet    Sig: Take 30 mg by mouth daily.  . Prenatal Vit-Fe Fumarate-FA (PRENATAL MULTIVITAMIN) TABS tablet    Sig: Take 1 tablet by mouth daily.  . calcium carbonate (TUMS - DOSED IN MG ELEMENTAL CALCIUM) 500 MG chewable tablet    Sig: Chew 2 tablets by mouth daily as needed for indigestion or heartburn.  Marland Kitchen acetaminophen (TYLENOL) 325 MG tablet    Sig: Take 650 mg by mouth every 6 (six) hours as needed for mild pain.    MDM: UA and Spec exam Assessment: Abdominal pain in pregnancy   Plan: Discussed with pt that fetus is doing well.  Reassurance given.  Discussed options to help with sleep, Tylenol pm or benadryl.  Discussed plan of care with Dr. Landry Mellow.  Will give pt ibuprofen 800mg  for discomfort for prn use.  No ibuprofen after 28 weeks. Discharge home in stable condition.       Pleas Koch  Prothero, CNM 04/13/2017 1:15 PM

## 2017-04-13 NOTE — MAU Note (Signed)
PT presents to MAU with complaints of lower abdominal cramping that started yesterday. Denies any vaginal bleeding or abnormal discharge

## 2017-06-16 ENCOUNTER — Encounter (HOSPITAL_COMMUNITY): Payer: Self-pay | Admitting: *Deleted

## 2017-06-16 ENCOUNTER — Inpatient Hospital Stay (HOSPITAL_COMMUNITY): Payer: 59

## 2017-06-16 ENCOUNTER — Inpatient Hospital Stay (HOSPITAL_COMMUNITY)
Admission: AD | Admit: 2017-06-16 | Discharge: 2017-06-16 | Disposition: A | Discharge status: Home / Self Care | Payer: 59 | Source: Ambulatory Visit | Attending: Obstetrics and Gynecology | Admitting: Obstetrics and Gynecology

## 2017-06-16 DIAGNOSIS — R197 Diarrhea, unspecified: Secondary | ICD-10-CM | POA: Diagnosis not present

## 2017-06-16 DIAGNOSIS — R5381 Other malaise: Secondary | ICD-10-CM | POA: Diagnosis not present

## 2017-06-16 DIAGNOSIS — Z9889 Other specified postprocedural states: Secondary | ICD-10-CM | POA: Diagnosis not present

## 2017-06-16 DIAGNOSIS — F419 Anxiety disorder, unspecified: Secondary | ICD-10-CM | POA: Insufficient documentation

## 2017-06-16 DIAGNOSIS — Z8249 Family history of ischemic heart disease and other diseases of the circulatory system: Secondary | ICD-10-CM | POA: Insufficient documentation

## 2017-06-16 DIAGNOSIS — O26893 Other specified pregnancy related conditions, third trimester: Secondary | ICD-10-CM | POA: Diagnosis present

## 2017-06-16 DIAGNOSIS — Z87891 Personal history of nicotine dependence: Secondary | ICD-10-CM | POA: Diagnosis not present

## 2017-06-16 DIAGNOSIS — Z800 Family history of malignant neoplasm of digestive organs: Secondary | ICD-10-CM | POA: Insufficient documentation

## 2017-06-16 DIAGNOSIS — O99343 Other mental disorders complicating pregnancy, third trimester: Secondary | ICD-10-CM | POA: Insufficient documentation

## 2017-06-16 DIAGNOSIS — O368130 Decreased fetal movements, third trimester, not applicable or unspecified: Secondary | ICD-10-CM | POA: Diagnosis not present

## 2017-06-16 DIAGNOSIS — Z79899 Other long term (current) drug therapy: Secondary | ICD-10-CM | POA: Insufficient documentation

## 2017-06-16 DIAGNOSIS — Z3A29 29 weeks gestation of pregnancy: Secondary | ICD-10-CM | POA: Diagnosis not present

## 2017-06-16 DIAGNOSIS — R109 Unspecified abdominal pain: Secondary | ICD-10-CM | POA: Diagnosis not present

## 2017-06-16 DIAGNOSIS — Z833 Family history of diabetes mellitus: Secondary | ICD-10-CM | POA: Diagnosis not present

## 2017-06-16 DIAGNOSIS — O9989 Other specified diseases and conditions complicating pregnancy, childbirth and the puerperium: Secondary | ICD-10-CM | POA: Diagnosis not present

## 2017-06-16 DIAGNOSIS — Z882 Allergy status to sulfonamides status: Secondary | ICD-10-CM | POA: Insufficient documentation

## 2017-06-16 DIAGNOSIS — O368190 Decreased fetal movements, unspecified trimester, not applicable or unspecified: Secondary | ICD-10-CM

## 2017-06-16 LAB — URINALYSIS, ROUTINE W REFLEX MICROSCOPIC
BILIRUBIN URINE: NEGATIVE
Glucose, UA: NEGATIVE mg/dL
HGB URINE DIPSTICK: NEGATIVE
KETONES UR: NEGATIVE mg/dL
Leukocytes, UA: NEGATIVE
Nitrite: NEGATIVE
PH: 6 (ref 5.0–8.0)
Protein, ur: NEGATIVE mg/dL
SPECIFIC GRAVITY, URINE: 1.004 — AB (ref 1.005–1.030)

## 2017-06-16 NOTE — Discharge Instructions (Signed)
Third Trimester of Pregnancy The third trimester is from week 28 through week 40 (months 7 through 9). The third trimester is a time when the unborn baby (fetus) is growing rapidly. At the end of the ninth month, the fetus is about 20 inches in length and weighs 6-10 pounds. Body changes during your third trimester Your body will continue to go through many changes during pregnancy. The changes vary from woman to woman. During the third trimester:  Your weight will continue to increase. You can expect to gain 25-35 pounds (11-16 kg) by the end of the pregnancy.  You may begin to get stretch marks on your hips, abdomen, and breasts.  You may urinate more often because the fetus is moving lower into your pelvis and pressing on your bladder.  You may develop or continue to have heartburn. This is caused by increased hormones that slow down muscles in the digestive tract.  You may develop or continue to have constipation because increased hormones slow digestion and cause the muscles that push waste through your intestines to relax.  You may develop hemorrhoids. These are swollen veins (varicose veins) in the rectum that can itch or be painful.  You may develop swollen, bulging veins (varicose veins) in your legs.  You may have increased body aches in the pelvis, back, or thighs. This is due to weight gain and increased hormones that are relaxing your joints.  You may have changes in your hair. These can include thickening of your hair, rapid growth, and changes in texture. Some women also have hair loss during or after pregnancy, or hair that feels dry or thin. Your hair will most likely return to normal after your baby is born.  Your breasts will continue to grow and they will continue to become tender. A yellow fluid (colostrum) may leak from your breasts. This is the first milk you are producing for your baby.  Your belly button may stick out.  You may notice more swelling in your hands,  face, or ankles.  You may have increased tingling or numbness in your hands, arms, and legs. The skin on your belly may also feel numb.  You may feel short of breath because of your expanding uterus.  You may have more problems sleeping. This can be caused by the size of your belly, increased need to urinate, and an increase in your body's metabolism.  You may notice the fetus "dropping," or moving lower in your abdomen (lightening).  You may have increased vaginal discharge.  You may notice your joints feel loose and you may have pain around your pelvic bone.  What to expect at prenatal visits You will have prenatal exams every 2 weeks until week 36. Then you will have weekly prenatal exams. During a routine prenatal visit:  You will be weighed to make sure you and the baby are growing normally.  Your blood pressure will be taken.  Your abdomen will be measured to track your baby's growth.  The fetal heartbeat will be listened to.  Any test results from the previous visit will be discussed.  You may have a cervical check near your due date to see if your cervix has softened or thinned (effaced).  You will be tested for Group B streptococcus. This happens between 35 and 37 weeks.  Your health care provider may ask you:  What your birth plan is.  How you are feeling.  If you are feeling the baby move.  If you have had  any abnormal symptoms, such as leaking fluid, bleeding, severe headaches, or abdominal cramping.  If you are using any tobacco products, including cigarettes, chewing tobacco, and electronic cigarettes.  If you have any questions.  Other tests or screenings that may be performed during your third trimester include:  Blood tests that check for low iron levels (anemia).  Fetal testing to check the health, activity level, and growth of the fetus. Testing is done if you have certain medical conditions or if there are problems during the  pregnancy.  Nonstress test (NST). This test checks the health of your baby to make sure there are no signs of problems, such as the baby not getting enough oxygen. During this test, a belt is placed around your belly. The baby is made to move, and its heart rate is monitored during movement.  What is false labor? False labor is a condition in which you feel small, irregular tightenings of the muscles in the womb (contractions) that usually go away with rest, changing position, or drinking water. These are called Braxton Hicks contractions. Contractions may last for hours, days, or even weeks before true labor sets in. If contractions come at regular intervals, become more frequent, increase in intensity, or become painful, you should see your health care provider. What are the signs of labor?  Abdominal cramps.  Regular contractions that start at 10 minutes apart and become stronger and more frequent with time.  Contractions that start on the top of the uterus and spread down to the lower abdomen and back.  Increased pelvic pressure and dull back pain.  A watery or bloody mucus discharge that comes from the vagina.  Leaking of amniotic fluid. This is also known as your "water breaking." It could be a slow trickle or a gush. Let your health care provider know if it has a color or strange odor. If you have any of these signs, call your health care provider right away, even if it is before your due date. Follow these instructions at home: Medicines  Follow your health care provider's instructions regarding medicine use. Specific medicines may be either safe or unsafe to take during pregnancy.  Take a prenatal vitamin that contains at least 600 micrograms (mcg) of folic acid.  If you develop constipation, try taking a stool softener if your health care provider approves. Eating and drinking  Eat a balanced diet that includes fresh fruits and vegetables, whole grains, good sources of protein  such as meat, eggs, or tofu, and low-fat dairy. Your health care provider will help you determine the amount of weight gain that is right for you.  Avoid raw meat and uncooked cheese. These carry germs that can cause birth defects in the baby.  If you have low calcium intake from food, talk to your health care provider about whether you should take a daily calcium supplement.  Eat four or five small meals rather than three large meals a day.  Limit foods that are high in fat and processed sugars, such as fried and sweet foods.  To prevent constipation: ? Drink enough fluid to keep your urine clear or pale yellow. ? Eat foods that are high in fiber, such as fresh fruits and vegetables, whole grains, and beans. Activity  Exercise only as directed by your health care provider. Most women can continue their usual exercise routine during pregnancy. Try to exercise for 30 minutes at least 5 days a week. Stop exercising if you experience uterine contractions.  Avoid heavy  lifting.  Do not exercise in extreme heat or humidity, or at high altitudes.  Wear low-heel, comfortable shoes.  Practice good posture.  You may continue to have sex unless your health care provider tells you otherwise. Relieving pain and discomfort  Take frequent breaks and rest with your legs elevated if you have leg cramps or low back pain.  Take warm sitz baths to soothe any pain or discomfort caused by hemorrhoids. Use hemorrhoid cream if your health care provider approves.  Wear a good support bra to prevent discomfort from breast tenderness.  If you develop varicose veins: ? Wear support pantyhose or compression stockings as told by your healthcare provider. ? Elevate your feet for 15 minutes, 3-4 times a day. Prenatal care  Write down your questions. Take them to your prenatal visits.  Keep all your prenatal visits as told by your health care provider. This is important. Safety  Wear your seat belt at  all times when driving.  Make a list of emergency phone numbers, including numbers for family, friends, the hospital, and police and fire departments. General instructions  Avoid cat litter boxes and soil used by cats. These carry germs that can cause birth defects in the baby. If you have a cat, ask someone to clean the litter box for you.  Do not travel far distances unless it is absolutely necessary and only with the approval of your health care provider.  Do not use hot tubs, steam rooms, or saunas.  Do not drink alcohol.  Do not use any products that contain nicotine or tobacco, such as cigarettes and e-cigarettes. If you need help quitting, ask your health care provider.  Do not use any medicinal herbs or unprescribed drugs. These chemicals affect the formation and growth of the baby.  Do not douche or use tampons or scented sanitary pads.  Do not cross your legs for long periods of time.  To prepare for the arrival of your baby: ? Take prenatal classes to understand, practice, and ask questions about labor and delivery. ? Make a trial run to the hospital. ? Visit the hospital and tour the maternity area. ? Arrange for maternity or paternity leave through employers. ? Arrange for family and friends to take care of pets while you are in the hospital. ? Purchase a rear-facing car seat and make sure you know how to install it in your car. ? Pack your hospital bag. ? Prepare the babys nursery. Make sure to remove all pillows and stuffed animals from the baby's crib to prevent suffocation.  Visit your dentist if you have not gone during your pregnancy. Use a soft toothbrush to brush your teeth and be gentle when you floss. Contact a health care provider if:  You are unsure if you are in labor or if your water has broken.  You become dizzy.  You have mild pelvic cramps, pelvic pressure, or nagging pain in your abdominal area.  You have lower back pain.  You have persistent  nausea, vomiting, or diarrhea.  You have an unusual or bad smelling vaginal discharge.  You have pain when you urinate. Get help right away if:  Your water breaks before 37 weeks.  You have regular contractions less than 5 minutes apart before 37 weeks.  You have a fever.  You are leaking fluid from your vagina.  You have spotting or bleeding from your vagina.  You have severe abdominal pain or cramping.  You have rapid weight loss or weight gain.  You have shortness of breath with chest pain.  You notice sudden or extreme swelling of your face, hands, ankles, feet, or legs.  Your baby makes fewer than 10 movements in 2 hours.  You have severe headaches that do not go away when you take medicine.  You have vision changes. Summary  The third trimester is from week 28 through week 40, months 7 through 9. The third trimester is a time when the unborn baby (fetus) is growing rapidly.  During the third trimester, your discomfort may increase as you and your baby continue to gain weight. You may have abdominal, leg, and back pain, sleeping problems, and an increased need to urinate.  During the third trimester your breasts will keep growing and they will continue to become tender. A yellow fluid (colostrum) may leak from your breasts. This is the first milk you are producing for your baby.  False labor is a condition in which you feel small, irregular tightenings of the muscles in the womb (contractions) that eventually go away. These are called Braxton Hicks contractions. Contractions may last for hours, days, or even weeks before true labor sets in.  Signs of labor can include: abdominal cramps; regular contractions that start at 10 minutes apart and become stronger and more frequent with time; watery or bloody mucus discharge that comes from the vagina; increased pelvic pressure and dull back pain; and leaking of amniotic fluid. This information is not intended to replace advice  given to you by your health care provider. Make sure you discuss any questions you have with your health care provider. Document Released: 11/26/2001 Document Revised: 05/09/2016 Document Reviewed: 02/02/2013 Elsevier Interactive Patient Education  2017 [place] Exhaustion Information WHAT IS HEAT EXHAUSTION? Heat exhaustion happens when your body gets overheated from hot weather or from exercise. Heat exhaustion can lead to heat stroke, a life-threatening condition that requires emergency care. Heat exhaustion is more likely to develop when:  You are exercising or being active.  You are in hot or humid weather.  You are in bright sunshine.  You are not drinking enough water.  WHO IS AT RISK FOR THIS CONDITION? This condition is more likely to develop in:  People who exercise in hot or humid weather.  People who exercise beyond their fitness level.  People who wear clothing that does not allow sweat to evaporate.  People who are dehydrated.  People who drink a lot of alcoholic beverages or beverages that have caffeine. This can lead to dehydration.  People who are age 76 or older.  Children.  People who have a medical condition such as heart disease, poor circulation, sickle cell disease, or high blood pressure.  People who have a fever.  People who are very overweight (obese).  WHAT ARE THE SYMPTOMS OF THIS CONDITION? Symptoms of heat exhaustion include:  Heavy sweating along with feeling weak, dizzy, light-headed, and nauseous.  Rapid heartbeat.  Headache.  Urine that is darker than normal.  Muscle cramps, such as in the leg or side (flank).  Moist, cool, and clammy skin.  Fatigue.  Thirst.  Confusion.  Fainting.  WHAT SHOULD I DO IF I THINK I HAVE THIS CONDITION? If you think that you have heat exhaustion, call your health care provider. Follow his or her instructions. You should also:  Call a friend or a family member and ask him or her  to stay with you.  Move to a cooler location, such as: ? Into the shade. ? In  front of a fan. ? An air-conditioned space.  Lie down and rest.  Slowly drink nonalcoholic, caffeine-free fluids.  Take off tight clothing or extra clothing.  Take a cool bath or shower, if possible. If you do not have access to a bath or shower, dab or mist cool water on your skin.  WHY IS IT IMPORTANT TO TREAT THIS CONDITION? It is important to take care of yourself and treat heat exhaustion as soon as possible. Untreated heat exhaustion can turn into heat stroke, which is a life-threatening condition that requires urgent medical treatment. HOW CAN I PREVENT THIS CONDITION? To prevent this condition:  Drink enough fluid to keep your urine clear or pale yellow. This helps your body to sweat properly.  Avoid outdoor activities on very hot or humid days.  Do not exercise or do other physical activity when you are not feeling well.  Take breaks often during physical activity.  Wear light-colored, loose-fitting, and lightweight clothing when it is hot outside.  Wear a hat and use sunscreen when exercising outdoors.  Avoid being outside during the hottest times of the day.  Check with your health care provider before you start any new activity, especially if you take medicine or have a medical condition.  Start any new activity slowly and work up to your fitness level.  HOW CAN I HELP TO PROTECT ELDERLY RELATIVES AND NEIGHBORS FROM THIS CONDITION? People who are age 63 or older are at greater risk for heat exhaustion. Their bodies have a harder time adjusting to heat. They are also more likely to have a medical condition or be on medicines that increase their risk for heat exhaustion. They may get heat exhaustion indoors if the heat is high for several days. You can help to protect them during hot weather by:  Checking on them two or more times each day.  Making sure that they are drinking plenty of  cool, nonalcoholic, and caffeine-free fluids.  Making sure that they use their air conditioner.  Taking them to a location where air conditioning is available.  Talking with their health care provider about their medical needs, medicines, and fluid requirements.  SEEK MEDICAL CARE IF:  Your symptoms last longer than 30 minutes.  SEEK IMMEDIATE MEDICAL CARE IF:  You have any symptoms of heat stroke. These include: ? Fever. ? Vomiting. ? Red skin. ? Inability to sweat, resulting in hot, dry skin. ? Excessive thirst. ? Rapid breathing. ? Headache. ? Confusion or disorientation. ? Fainting. ? Seizures. These symptoms may represent a serious problem that is an emergency. Do not wait to see if the symptoms will go away. Get medical help right away. Call your local emergency services (911 in the [place] Do not drive yourself to the hospital. This information is not intended to replace advice given to you by your health care provider. Make sure you discuss any questions you have with your health care provider. Document Released: 09/10/2008 Document Revised: 06/21/2016 Document Reviewed: 03/24/2016 Elsevier Interactive Patient Education  [employer].

## 2017-06-16 NOTE — MAU Provider Note (Signed)
Chief Complaint:  Abdominal Pain; Decreased Fetal Movement; and Diarrhea   First Provider Initiated Contact with Patient 06/16/17 1216     HPI: Beverly Tate is a 36 y.o. G1P0000 at 46w5dwho presents to maternity admissions reporting decreased fetal movement today.   As we speaks, audible movement is heard and patient feels it.   She had some abdominal cramping "all over" yesterday with 1-2 loose stools.  No cramping today, no loose stools. No N/V or fever.  Just "feels eehhh".    She denies LOF, vaginal bleeding, vaginal itching/burning, urinary symptoms, h/a, dizziness, n/v, diarrhea, constipation or fever/chills.    Abdominal Pain  This is a new problem. The current episode started yesterday. The onset quality is gradual. The problem occurs intermittently. The problem has been resolved. The pain is located in the generalized abdominal region. The quality of the pain is cramping and colicky. The abdominal pain does not radiate. Associated symptoms include diarrhea (1-2 loose stools yesterday). Pertinent negatives include no anorexia, constipation, dysuria, fever, frequency, myalgias, nausea or vomiting. Nothing aggravates the pain. The pain is relieved by nothing. She has tried nothing for the symptoms.  Diarrhea   This is a new problem. The current episode started yesterday. The problem occurs less than 2 times per day. The problem has been resolved. Associated symptoms include abdominal pain. Pertinent negatives include no fever, myalgias or vomiting. Nothing aggravates the symptoms. There are no known risk factors. She has tried nothing for the symptoms.    RN Note: Yesterday +abdominal cramping off and on--took tylenol last night and helped +decreased fetal movement--feeling movement; less than usual Patient states " she just feels bad" +diarrhea  Denies vaginal bleeding or discharge   Past Medical History: Past Medical History:  Diagnosis Date  . Allergic rhinitis   . Anxiety    . Fibroid   . HPV test positive     Past obstetric history: OB History  Gravida Para Term Preterm AB Living  1 0 0 0 0 0  SAB TAB Ectopic Multiple Live Births  0 0 0 0      # Outcome Date GA Lbr Len/2nd Weight Sex Delivery Anes PTL Lv  1 Current               Past Surgical History: Past Surgical History:  Procedure Laterality Date  . KNEE SURGERY Left 07/2002   ACL repair    Family History: Family History  Problem Relation Age of Onset  . Colon cancer Maternal Grandfather 106  . Diabetes Mellitus II Mother   . Hypertension Mother   . Thyroid disease Mother   . Anemia Mother   . Diabetes Mellitus II Father   . Hypertension Father   . Diabetes Maternal Grandmother   . Diabetes Paternal Grandmother   . Anemia Sister   . Sarcoidosis Sister     Social History: Social History  Substance Use Topics  . Smoking status: Former Smoker    Packs/day: 1.00    Years: 5.00    Types: Cigarettes  . Smokeless tobacco: Never Used  . Alcohol use 0.6 - 1.2 oz/week    1 - 2 Standard drinks or equivalent per week    Allergies:  Allergies  Allergen Reactions  . Sulfamethoxazole-Trimethoprim Nausea Only    Meds:  Prescriptions Prior to Admission  Medication Sig Dispense Refill Last Dose  . acetaminophen (TYLENOL) 325 MG tablet Take 650 mg by mouth every 6 (six) hours as needed for mild pain.   04/13/2017 at  0800  . calcium carbonate (TUMS - DOSED IN MG ELEMENTAL CALCIUM) 500 MG chewable tablet Chew 2 tablets by mouth daily as needed for indigestion or heartburn.   Past Week at Unknown time  . fluticasone (FLONASE) 50 MCG/ACT nasal spray Place 2 sprays into both nostrils daily. (Patient not taking: Reported on 04/13/2017) 16 g 6 Not Taking at Unknown time  . NIFEdipine (PROCARDIA-XL/ADALAT-CC/NIFEDICAL-XL) 30 MG 24 hr tablet Take 30 mg by mouth daily.   04/12/2017 at Unknown time  . Prenatal Vit-Fe Fumarate-FA (PRENATAL MULTIVITAMIN) TABS tablet Take 1 tablet by mouth daily.    04/12/2017 at Unknown time    I have reviewed patient's Past Medical Hx, Surgical Hx, Family Hx, Social Hx, medications and allergies.   ROS:  Review of Systems  Constitutional: Negative for fever.  Gastrointestinal: Positive for abdominal pain and diarrhea (1-2 loose stools yesterday). Negative for anorexia, constipation, nausea and vomiting.  Genitourinary: Negative for dysuria and frequency.  Musculoskeletal: Negative for myalgias.   Other systems negative  Physical Exam  Patient Vitals for the past 24 hrs:  BP Temp Temp src Pulse Resp SpO2 Weight  06/16/17 1212 105/72 98.2 F (36.8 C) Oral (!) 103 17 100 % 279 lb 1.9 oz (126.6 kg)   Constitutional: Well-developed, well-nourished female in no acute distress.  Cardiovascular: normal rate and rhythm Respiratory: normal effort, clear to auscultation bilaterally GI: Abd soft, non-tender, gravid appropriate for gestational age.   No rebound or guarding. MS: Extremities nontender, no edema, normal ROM Neurologic: Alert and oriented x 4.  GU: Neg CVAT.  PELVIC EXAM: Dilation: Closed Effacement (%): Thick Cervical Position: Posterior Exam by:: M.Beadie Matsunaga,CNM    FHT:  Baseline 140 , moderate variability, accelerations present, one spontaneous deceleration to 80-90s while I was there.  Turned pt to left and it resolved. Contractions: Rare   Labs:    Results for orders placed or performed during the hospital encounter of 06/16/17 (from the past 24 hour(s))  Urinalysis, Routine w reflex microscopic     Status: Abnormal   Collection Time: 06/16/17 12:15 PM  Result Value Ref Range   Color, Urine STRAW (A) YELLOW   APPearance CLEAR CLEAR   Specific Gravity, Urine 1.004 (L) 1.005 - 1.030   pH 6.0 5.0 - 8.0   Glucose, UA NEGATIVE NEGATIVE mg/dL   Hgb urine dipstick NEGATIVE NEGATIVE   Bilirubin Urine NEGATIVE NEGATIVE   Ketones, ur NEGATIVE NEGATIVE mg/dL   Protein, ur NEGATIVE NEGATIVE mg/dL   Nitrite NEGATIVE NEGATIVE    Leukocytes, UA NEGATIVE NEGATIVE     Imaging:  BPP 8/8, AFI 19.9  MAU Course/MDM: I have ordered labs and reviewed results.  NST reviewed and noted two spontaneous sharp variable decels with overall reassuring variabilty and 10x10 accels.  Consult Dr Landry Mellow with presentation, exam findings and test results. Since FHR tracing has improved, BPP is reassuring,  and pt now feels fetal movement, may discharge home. Treatments in MAU included BPP with AFI.    Assessment: SIUP at [redacted]w[redacted]d Decreased fetal movement, now feeling movement Reassuring BPP  Plan: Discharge home Labor precautions and fetal kick counts Follow up in Office for prenatal visits and recheck of status  Encouraged to return here or to other Urgent Care/ED if she develops worsening of symptoms, increase in pain, fever, or other concerning symptoms.   Pt stable at time of discharge.  Hansel Feinstein CNM, MSN Certified Nurse-Midwife 06/16/2017 12:16 PM

## 2017-06-16 NOTE — MAU Note (Signed)
Urine in lab 

## 2017-06-16 NOTE — MAU Note (Signed)
Yesterday +abdominal cramping off and on--took tylenol last night and helped +decreased fetal movement--feeling movement; less than usual Patient states " she just feels bad" +diarrhea  Denies vaginal bleeding or discharge

## 2017-07-07 DIAGNOSIS — Z23 Encounter for immunization: Secondary | ICD-10-CM | POA: Diagnosis not present

## 2017-07-29 LAB — OB RESULTS CONSOLE GBS: GBS: POSITIVE

## 2017-08-11 ENCOUNTER — Telehealth (HOSPITAL_COMMUNITY): Payer: Self-pay | Admitting: *Deleted

## 2017-08-11 ENCOUNTER — Encounter (HOSPITAL_COMMUNITY): Payer: Self-pay | Admitting: *Deleted

## 2017-08-11 NOTE — Telephone Encounter (Signed)
Preadmission screen  

## 2017-08-22 ENCOUNTER — Other Ambulatory Visit: Payer: Self-pay | Admitting: Obstetrics and Gynecology

## 2017-08-22 NOTE — H&P (Signed)
Beverly Tate is a 36 y.o. female presenting for induction of labor at 28 wks and 5 days due to chronic hypertension. This has been well controlled with procardia XL 30 mg daily. Pregnancy is also complicated by Large uterine fibroids. The largest is 10 cm and posterior subserosal. Prenatal care provided by Dr. Christophe [facility] with [facility]   OB History    Gravida Para Term Preterm AB Living   1 0 0 0 0 0   SAB TAB Ectopic Multiple Live Births   0 0 0 0       Past Medical History:  Diagnosis Date  . Allergic rhinitis   . Anxiety   . Complication of anesthesia    disorriented and chills when woke up  . Fibroid   . HPV test positive    Past Surgical History:  Procedure Laterality Date  . KNEE SURGERY Left 07/2002   ACL repair   Family History: family history includes Anemia in her mother and sister; Colon cancer (age of onset: 53) in her maternal grandfather; Diabetes in her father, maternal grandmother, mother, and paternal grandmother; Hepatitis C in her father; Hypertension in her father and mother; Sarcoidosis in her sister; Thyroid disease in her mother; Varicose Veins in her father. Social History:  reports that she has quit smoking. Her smoking use included Cigarettes. She has a 5.00 pack-year smoking history. She has never used smokeless tobacco. She reports that she drinks about 0.6 - 1.2 oz of alcohol per week . She reports that she does not use drugs.     Maternal Diabetes: No Genetic Screening: Normal Maternal Ultrasounds/Referrals: Normal Fetal Ultrasounds or other Referrals:  None Maternal Substance Abuse:  No Significant Maternal Medications:  Meds include: Other:  Significant Maternal Lab Results:  Lab values include: Group B Strep positive Other Comments:  None  Review of Systems  Constitutional: Negative.   HENT: Negative.   Eyes: Negative.   Respiratory: Negative.   Cardiovascular: Negative.   Gastrointestinal: Negative.   Genitourinary: Negative.    Musculoskeletal: Negative.   Skin: Negative.   Neurological: Negative.   Endo/Heme/Allergies: Negative.   Psychiatric/Behavioral: Negative.    Maternal Medical History:  Prenatal complications: PIH.   Prenatal Complications - Diabetes: none.      Last menstrual period 11/20/2016. Maternal Exam:  Abdomen: Estimated fetal weight is 6 lbs 9 oz  on ultrasound 08/11/2017.   Fetal presentation: vertex     Physical Exam  Vitals reviewed. Constitutional: She is oriented to person, place, and time. She appears well-developed and well-nourished.  HENT:  Head: Normocephalic and atraumatic.  Eyes: Pupils are equal, round, and reactive to light. Conjunctivae are normal.  Neck: Normal range of motion. Neck supple.  Cardiovascular: Normal rate and regular rhythm.   Respiratory: Effort normal and breath sounds normal.  GI: There is no tenderness.  Genitourinary: Vagina normal.  Musculoskeletal: Normal range of motion.  Neurological: She is alert and oriented to person, place, and time. She has normal reflexes.  Skin: Skin is warm and dry.  Psychiatric: She has a normal mood and affect.    Prenatal labs: ABO, Rh: B/Positive/-- (02/13 0000) Antibody: Negative (02/13 0000) Rubella: Immune (02/13 0000) RPR: Nonreactive (02/13 0000)  HBsAg: Negative (02/13 0000)  HIV: Non-reactive (02/13 0000)  GBS: Positive (08/14 0000)   Assessment/Plan: 39 wks and 5 days  With chronic hypertension.  Induction with cytotec for cervical ripening Procardia XL 30 mg daily Large fibroids- monitor for postpartum hemorrhage  Anticipate SVD  Beverly Tate J. 08/22/2017, 4:03 PM

## 2017-08-22 NOTE — H&P (Deleted)
  The note originally documented on this encounter has been moved the the encounter in which it belongs.  

## 2017-08-25 ENCOUNTER — Inpatient Hospital Stay (HOSPITAL_COMMUNITY): Payer: 59 | Admitting: Anesthesiology

## 2017-08-25 ENCOUNTER — Inpatient Hospital Stay (HOSPITAL_COMMUNITY)
Admission: RE | Admit: 2017-08-25 | Discharge: 2017-08-27 | DRG: 774 | Disposition: A | Discharge status: Home / Self Care | Payer: 59 | Source: Ambulatory Visit | Attending: Obstetrics and Gynecology | Admitting: Obstetrics and Gynecology

## 2017-08-25 ENCOUNTER — Encounter (HOSPITAL_COMMUNITY): Payer: Self-pay

## 2017-08-25 DIAGNOSIS — O10913 Unspecified pre-existing hypertension complicating pregnancy, third trimester: Secondary | ICD-10-CM | POA: Diagnosis not present

## 2017-08-25 DIAGNOSIS — Z87891 Personal history of nicotine dependence: Secondary | ICD-10-CM

## 2017-08-25 DIAGNOSIS — O99214 Obesity complicating childbirth: Secondary | ICD-10-CM | POA: Diagnosis present

## 2017-08-25 DIAGNOSIS — O3413 Maternal care for benign tumor of corpus uteri, third trimester: Secondary | ICD-10-CM | POA: Diagnosis present

## 2017-08-25 DIAGNOSIS — Z79899 Other long term (current) drug therapy: Secondary | ICD-10-CM

## 2017-08-25 DIAGNOSIS — Z882 Allergy status to sulfonamides status: Secondary | ICD-10-CM | POA: Diagnosis not present

## 2017-08-25 DIAGNOSIS — O99824 Streptococcus B carrier state complicating childbirth: Secondary | ICD-10-CM | POA: Diagnosis present

## 2017-08-25 DIAGNOSIS — Z3A39 39 weeks gestation of pregnancy: Secondary | ICD-10-CM

## 2017-08-25 DIAGNOSIS — O1092 Unspecified pre-existing hypertension complicating childbirth: Principal | ICD-10-CM | POA: Diagnosis present

## 2017-08-25 DIAGNOSIS — D259 Leiomyoma of uterus, unspecified: Secondary | ICD-10-CM | POA: Diagnosis present

## 2017-08-25 DIAGNOSIS — O10919 Unspecified pre-existing hypertension complicating pregnancy, unspecified trimester: Secondary | ICD-10-CM

## 2017-08-25 DIAGNOSIS — Z6841 Body Mass Index (BMI) 40.0 and over, adult: Secondary | ICD-10-CM | POA: Diagnosis not present

## 2017-08-25 HISTORY — DX: Unspecified pre-existing hypertension complicating pregnancy, unspecified trimester: O10.919

## 2017-08-25 LAB — CBC
HCT: 34.3 % — ABNORMAL LOW (ref 36.0–46.0)
HCT: 37 % (ref 36.0–46.0)
HEMOGLOBIN: 12.3 g/dL (ref 12.0–15.0)
Hemoglobin: 11.8 g/dL — ABNORMAL LOW (ref 12.0–15.0)
MCH: 27.6 pg (ref 26.0–34.0)
MCH: 27 pg (ref 26.0–34.0)
MCHC: 34.4 g/dL (ref 30.0–36.0)
MCHC: 33.2 g/dL (ref 30.0–36.0)
MCV: 80.3 fL (ref 78.0–100.0)
MCV: 81.1 fL (ref 78.0–100.0)
PLATELETS: 372 10*3/uL (ref 150–400)
Platelets: 339 10*3/uL (ref 150–400)
RBC: 4.27 MIL/uL (ref 3.87–5.11)
RBC: 4.56 MIL/uL (ref 3.87–5.11)
RDW: 16.5 % — AB (ref 11.5–15.5)
RDW: 16.4 % — ABNORMAL HIGH (ref 11.5–15.5)
WBC: 11.5 10*3/uL — ABNORMAL HIGH (ref 4.0–10.5)
WBC: 12.6 10*3/uL — AB (ref 4.0–10.5)

## 2017-08-25 LAB — COMPREHENSIVE METABOLIC PANEL
ALT: 14 U/L (ref 14–54)
ANION GAP: 11 (ref 5–15)
AST: 18 U/L (ref 15–41)
Albumin: 3 g/dL — ABNORMAL LOW (ref 3.5–5.0)
Alkaline Phosphatase: 172 U/L — ABNORMAL HIGH (ref 38–126)
BUN: 9 mg/dL (ref 6–20)
CHLORIDE: 106 mmol/L (ref 101–111)
CO2: 21 mmol/L — ABNORMAL LOW (ref 22–32)
CREATININE: 0.5 mg/dL (ref 0.44–1.00)
Calcium: 9.4 mg/dL (ref 8.9–10.3)
Glucose, Bld: 102 mg/dL — ABNORMAL HIGH (ref 65–99)
Potassium: 4 mmol/L (ref 3.5–5.1)
SODIUM: 138 mmol/L (ref 135–145)
Total Bilirubin: 0.1 mg/dL — ABNORMAL LOW (ref 0.3–1.2)
Total Protein: 7.1 g/dL (ref 6.5–8.1)

## 2017-08-25 LAB — TYPE AND SCREEN
ABO/RH(D): B POS
ANTIBODY SCREEN: NEGATIVE

## 2017-08-25 LAB — LACTATE DEHYDROGENASE: LDH: 138 U/L (ref 98–192)

## 2017-08-25 LAB — URIC ACID: URIC ACID, SERUM: 4.8 mg/dL (ref 2.3–6.6)

## 2017-08-25 LAB — ABO/RH: ABO/RH(D): B POS

## 2017-08-25 LAB — PROTEIN / CREATININE RATIO, URINE
CREATININE, URINE: 60 mg/dL
Protein Creatinine Ratio: 0.1 mg/mg{Cre} (ref 0.00–0.15)
Total Protein, Urine: 6 mg/dL

## 2017-08-25 LAB — RPR: RPR Ser Ql: NONREACTIVE

## 2017-08-25 MED ORDER — LACTATED RINGERS IV SOLN
500.0000 mL | Freq: Once | INTRAVENOUS | Status: AC
  Administered 2017-08-25: 500 mL via INTRAVENOUS

## 2017-08-25 MED ORDER — LACTATED RINGERS IV SOLN: 500.0000 mL | Freq: Once | INTRAVENOUS | Status: DC

## 2017-08-25 MED ORDER — LACTATED RINGERS IV SOLN: INTRAVENOUS | Status: DC

## 2017-08-25 MED ORDER — OXYTOCIN 40 UNITS IN LACTATED RINGERS INFUSION - SIMPLE MED
2.5000 [IU]/h | INTRAVENOUS | Status: DC
  Administered 2017-08-26: 2.5 [IU]/h via INTRAVENOUS
  Filled 2017-08-25: qty 1000

## 2017-08-25 MED ORDER — PENICILLIN G POTASSIUM 5000000 UNITS IJ SOLR
5.0000 10*6.[IU] | Freq: Once | INTRAVENOUS | Status: AC
  Administered 2017-08-25: 5 10*6.[IU] via INTRAVENOUS
  Filled 2017-08-25: qty 5

## 2017-08-25 MED ORDER — DIPHENHYDRAMINE HCL 50 MG/ML IJ SOLN: 12.5000 mg | INTRAMUSCULAR | Status: DC | PRN

## 2017-08-25 MED ORDER — OXYTOCIN BOLUS FROM INFUSION
500.0000 mL | Freq: Once | INTRAVENOUS | Status: AC
  Administered 2017-08-26: 500 mL via INTRAVENOUS

## 2017-08-25 MED ORDER — LACTATED RINGERS IV SOLN
500.0000 mL | INTRAVENOUS | Status: DC | PRN
  Administered 2017-08-25: 500 mL via INTRAVENOUS

## 2017-08-25 MED ORDER — PHENYLEPHRINE 40 MCG/ML (10ML) SYRINGE FOR IV PUSH (FOR BLOOD PRESSURE SUPPORT): 80.0000 ug | PREFILLED_SYRINGE | INTRAVENOUS | Status: DC | PRN

## 2017-08-25 MED ORDER — PENICILLIN G POT IN DEXTROSE 60000 UNIT/ML IV SOLN
3.0000 10*6.[IU] | INTRAVENOUS | Status: DC
  Administered 2017-08-25 (×5): 3 10*6.[IU] via INTRAVENOUS
  Filled 2017-08-25 (×12): qty 50

## 2017-08-25 MED ORDER — ACETAMINOPHEN 325 MG PO TABS: 650.0000 mg | ORAL_TABLET | ORAL | Status: DC | PRN

## 2017-08-25 MED ORDER — SOD CITRATE-CITRIC ACID 500-334 MG/5ML PO SOLN
30.0000 mL | ORAL | Status: DC | PRN
  Administered 2017-08-25: 30 mL via ORAL
  Filled 2017-08-25: qty 15

## 2017-08-25 MED ORDER — LIDOCAINE HCL (PF) 1 % IJ SOLN
INTRAMUSCULAR | Status: DC | PRN
  Administered 2017-08-25: 3 mL
  Administered 2017-08-25 (×2): 5 mL via EPIDURAL
  Administered 2017-08-25: 1.5 mL

## 2017-08-25 MED ORDER — OXYCODONE-ACETAMINOPHEN 5-325 MG PO TABS: 2.0000 | ORAL_TABLET | ORAL | Status: DC | PRN

## 2017-08-25 MED ORDER — ONDANSETRON HCL 4 MG/2ML IJ SOLN: 4.0000 mg | Freq: Four times a day (QID) | INTRAMUSCULAR | Status: DC | PRN

## 2017-08-25 MED ORDER — EPHEDRINE 5 MG/ML INJ
10.0000 mg | INTRAVENOUS | Status: DC | PRN
  Filled 2017-08-25: qty 4
  Filled 2017-08-25: qty 2

## 2017-08-25 MED ORDER — LACTATED RINGERS IV SOLN
INTRAVENOUS | Status: DC
  Administered 2017-08-25 (×3): via INTRAVENOUS

## 2017-08-25 MED ORDER — PHENYLEPHRINE 40 MCG/ML (10ML) SYRINGE FOR IV PUSH (FOR BLOOD PRESSURE SUPPORT)
80.0000 ug | PREFILLED_SYRINGE | INTRAVENOUS | Status: DC | PRN
  Administered 2017-08-25: 80 ug via INTRAVENOUS
  Filled 2017-08-25: qty 5
  Filled 2017-08-25: qty 10

## 2017-08-25 MED ORDER — EPHEDRINE 5 MG/ML INJ
10.0000 mg | INTRAVENOUS | Status: DC | PRN
  Administered 2017-08-25: 10 mg via INTRAVENOUS
  Filled 2017-08-25: qty 2

## 2017-08-25 MED ORDER — FENTANYL CITRATE (PF) 100 MCG/2ML IJ SOLN
50.0000 ug | INTRAMUSCULAR | Status: DC | PRN
  Administered 2017-08-25 (×2): 100 ug via INTRAVENOUS
  Filled 2017-08-25 (×2): qty 2

## 2017-08-25 MED ORDER — FENTANYL 2.5 MCG/ML BUPIVACAINE 1/10 % EPIDURAL INFUSION (WH - ANES)
14.0000 mL/h | INTRAMUSCULAR | Status: DC | PRN
  Administered 2017-08-25: 14 mL/h via EPIDURAL
  Administered 2017-08-25: 1.5 mL/h via EPIDURAL
  Filled 2017-08-25: qty 100

## 2017-08-25 MED ORDER — NIFEDIPINE ER OSMOTIC RELEASE 30 MG PO TB24: 30.0000 mg | ORAL_TABLET | Freq: Every day | ORAL | Status: DC

## 2017-08-25 MED ORDER — LIDOCAINE HCL (PF) 1 % IJ SOLN
30.0000 mL | INTRAMUSCULAR | Status: DC | PRN
  Administered 2017-08-26: 30 mL via SUBCUTANEOUS
  Filled 2017-08-25: qty 30

## 2017-08-25 MED ORDER — MISOPROSTOL 25 MCG QUARTER TABLET
25.0000 ug | ORAL_TABLET | ORAL | Status: DC | PRN
  Administered 2017-08-25 (×3): 25 ug via VAGINAL
  Filled 2017-08-25 (×4): qty 1

## 2017-08-25 MED ORDER — EPHEDRINE 5 MG/ML INJ: 10.0000 mg | INTRAVENOUS | Status: DC | PRN

## 2017-08-25 MED ORDER — HYDROXYZINE HCL 50 MG PO TABS
50.0000 mg | ORAL_TABLET | Freq: Four times a day (QID) | ORAL | Status: DC | PRN
  Filled 2017-08-25: qty 1

## 2017-08-25 MED ORDER — OXYCODONE-ACETAMINOPHEN 5-325 MG PO TABS: 1.0000 | ORAL_TABLET | ORAL | Status: DC | PRN

## 2017-08-25 MED ORDER — PHENYLEPHRINE 40 MCG/ML (10ML) SYRINGE FOR IV PUSH (FOR BLOOD PRESSURE SUPPORT)
80.0000 ug | PREFILLED_SYRINGE | INTRAVENOUS | Status: AC | PRN
  Administered 2017-08-25 (×3): 80 ug via INTRAVENOUS

## 2017-08-25 MED ORDER — TERBUTALINE SULFATE 1 MG/ML IJ SOLN
0.2500 mg | Freq: Once | INTRAMUSCULAR | Status: DC | PRN
  Filled 2017-08-25: qty 1

## 2017-08-25 NOTE — Anesthesia Procedure Notes (Signed)
Epidural Patient location during procedure: OB Start time: 08/25/2017 6:57 PM End time: 08/25/2017 7:50 PM  Staffing Anesthesiologist: Annye Asa Performed: anesthesiologist   Preanesthetic Checklist Completed: patient identified, surgical consent, pre-op evaluation, timeout performed, IV checked, risks and benefits discussed and monitors and equipment checked  Epidural Patient position: sitting Prep: site prepped and draped and DuraPrep Patient monitoring: blood pressure, continuous pulse ox and heart rate Approach: midline Location: L3-L4 Injection technique: LOR air  Needle:  Needle type: Tuohy  Needle gauge: 17 G Needle length: 9 cm Needle insertion depth: 7 cm Catheter type: closed end flexible Catheter size: 19 Gauge Catheter at skin depth: 13 cm Test dose: positive (1% lidocaine)  Assessment Events: blood not aspirated, injection not painful, no injection resistance, negative IV test and no paresthesia  Additional Notes Pt identified in Labor room.  Monitors applied. Working IV access confirmed. Sterile prep, drape lumbar spine.  1% lido local L 3,4.  #17ga Touhy LOR air at 7 cm L 3,4, unable to thread cath, test doses through needle ok, still difficult cath insertion.  Then cath finally advanced easily to 13 cm skin. Repeat test dose after possible CSF vs local aspiration from cath, Test dose positive for SAB.  Cath taped, no further dosing. Level T4, required treatment of hypotension, no heme aspirated, tolerated well.  Will reassess progress of labor and further analgesia once block recedes.  Jenita Seashore, MDReason for block:procedure for pain

## 2017-08-25 NOTE — Anesthesia Pain Management Evaluation Note (Signed)
  CRNA Pain Management Visit Note  Patient: Beverly Tate, 36 y.o., female  "Hello I am a member of the anesthesia team at [facility]. We have an anesthesia team available at all times to provide care throughout the hospital, including epidural management and anesthesia for C-section. I don't know your plan for the delivery whether it a natural birth, water birth, IV sedation, nitrous supplementation, doula or epidural, but we want to meet your pain goals."   1.Was your pain managed to your expectations on prior hospitalizations?   No prior hospitalizations  2.What is your expectation for pain management during this hospitalization?     Epidural, IV pain meds and Nitrous Oxide  3.How can we help you reach that goal? Be available  Record the patient's initial score and the patient's pain goal.   Pain: 3  Pain Goal: 5 [facility] wants you to be able to say your pain was always managed very well.  [facility] 08/25/2017

## 2017-08-25 NOTE — Anesthesia Preprocedure Evaluation (Addendum)
Anesthesia Evaluation  Patient identified by MRN, date of birth, ID band Patient awake    Reviewed: Allergy & Precautions, NPO status , Patient's Chart, lab work & pertinent test results  History of Anesthesia Complications Negative for: history of anesthetic complications  Airway Mallampati: I  TM Distance: >3 FB Neck ROM: Full    Dental  (+) Dental Advisory Given   Pulmonary former smoker,    breath sounds clear to auscultation       Cardiovascular hypertension, Pt. on medications  Rhythm:Regular Rate:Normal     Neuro/Psych Anxiety negative neurological ROS     GI/Hepatic Neg liver ROS, GERD  ,  Endo/Other  Morbid obesity  Renal/GU negative Renal ROS     Musculoskeletal   Abdominal (+) + obese,   Peds  Hematology plt 339k   Anesthesia Other Findings   Reproductive/Obstetrics (+) Pregnancy                            Anesthesia Physical Anesthesia Plan  ASA: III  Anesthesia Plan: Epidural   Post-op Pain Management:    Induction:   PONV Risk Score and Plan: Treatment may vary due to age or medical condition  Airway Management Planned: Natural Airway  Additional Equipment:   Intra-op Plan:   Post-operative Plan:   Informed Consent: I have reviewed the patients History and Physical, chart, labs and discussed the procedure including the risks, benefits and alternatives for the proposed anesthesia with the patient or authorized representative who has indicated his/her understanding and acceptance.   Dental advisory given  Plan Discussed with:   Anesthesia Plan Comments: (Patient identified. Risks/Benefits/Options discussed with patient including but not limited to bleeding, infection, nerve damage, paralysis, failed block, incomplete pain control, headache, blood pressure changes, nausea, vomiting, reactions to medication both or allergic, itching and postpartum back pain.  Confirmed with bedside nurse the patient's most recent platelet count. Confirmed with patient that they are not currently taking any anticoagulation, have any bleeding history or any family history of bleeding disorders. Patient expressed understanding and wished to proceed. All questions were answered. )       Anesthesia Quick Evaluation

## 2017-08-25 NOTE — Progress Notes (Signed)
Beverly Tate is a 36 y.o. G1P0000 at [redacted]w[redacted]d  admitted for induction of labor due to Hypertension.  Subjective: Patient reports SROM that just occurred. Contractions feel painful she is interested in an epidural   Objective: BP 105/69   Pulse 87   Temp 98 F (36.7 C) (Axillary)   Resp 18   Ht 5\' 9"  (1.753 m)   Wt 127 kg (280 lb)   LMP 11/20/2016   SpO2 100%   BMI 41.35 kg/m  No intake/output data recorded. No intake/output data recorded.  FHT:  FHR: 140 bpm, variability: moderate,  accelerations:  Present,  decelerations:  Present variable UC:   regular, every 2 minutes SVE:  3/80/-2 IUPC and FSE placed  Labs: Lab Results  Component Value Date   WBC 12.6 (H) 08/25/2017   HGB 12.3 08/25/2017   HCT 37.0 08/25/2017   MCV 81.1 08/25/2017   PLT 339 08/25/2017    Assessment / Plan: INduction of labor due to chronic hypertension,.   Labor: progressing s/p cytotec.  Preeclampsia:  NA Fetal Wellbeing:  Category II will start amnioinfusion if variables continue  Pain Control:  planning epidural  I/D:  [place] Anticipated MOD:  NSVD  Autumn Gunn J. 08/25/2017, 8:48 PM

## 2017-08-25 NOTE — Progress Notes (Signed)
Beverly Tate is a 36 y.o. G1P0000 at [redacted]w[redacted]d  admitted for induction of labor due to Hypertension.  Subjective: Contractions rated as 7 out of 10   Objective: BP 101/64   Pulse 83   Temp 99 F (37.2 C)   Resp 18   Ht 5\' 9"  (1.753 m)   Wt 127 kg (280 lb)   LMP 11/20/2016   BMI 41.35 kg/m  No intake/output data recorded. No intake/output data recorded.  FHT:  FHR: 130 bpm, variability: moderate,  accelerations:  Present,  decelerations:  Absent and   UC:   irregular, every 2-5 minutes SVE:   1.5/75/-2 Cytotec #3 placed  Labs: Lab Results  Component Value Date   WBC 11.5 (H) 08/25/2017   HGB 11.8 (L) 08/25/2017   HCT 34.3 (L) 08/25/2017   MCV 80.3 08/25/2017   PLT 372 08/25/2017    Assessment / Plan: induction of labor due to htn  Labor: continue cytotec.. plan to start pitocin after cytotec #3 if needed  Preeclampsia:  NA Fetal Wellbeing:  Category I Pain Control:  Nitrous Oxide I/D:  penicillin in labor or with rupture  Anticipated MOD:  NSVD  Cindia Hustead J. 08/25/2017, 1:04 PM

## 2017-08-26 ENCOUNTER — Encounter (HOSPITAL_COMMUNITY): Payer: Self-pay

## 2017-08-26 LAB — CBC
HCT: 31.8 % — ABNORMAL LOW (ref 36.0–46.0)
HEMATOCRIT: 37.7 % (ref 36.0–46.0)
HEMOGLOBIN: 12.6 g/dL (ref 12.0–15.0)
Hemoglobin: 10.7 g/dL — ABNORMAL LOW (ref 12.0–15.0)
MCH: 27 pg (ref 26.0–34.0)
MCH: 27.2 pg (ref 26.0–34.0)
MCHC: 33.4 g/dL (ref 30.0–36.0)
MCHC: 33.6 g/dL (ref 30.0–36.0)
MCV: 80.9 fL (ref 78.0–100.0)
MCV: 80.9 fL (ref 78.0–100.0)
PLATELETS: 328 10*3/uL (ref 150–400)
Platelets: 324 10*3/uL (ref 150–400)
RBC: 4.66 MIL/uL (ref 3.87–5.11)
RBC: 3.93 MIL/uL (ref 3.87–5.11)
RDW: 16.5 % — ABNORMAL HIGH (ref 11.5–15.5)
RDW: 16.4 % — AB (ref 11.5–15.5)
WBC: 16.3 10*3/uL — ABNORMAL HIGH (ref 4.0–10.5)
WBC: 17.2 10*3/uL — AB (ref 4.0–10.5)

## 2017-08-26 MED ORDER — METHYLERGONOVINE MALEATE 0.2 MG/ML IJ SOLN: 0.2000 mg | INTRAMUSCULAR | Status: DC | PRN

## 2017-08-26 MED ORDER — METHYLERGONOVINE MALEATE 0.2 MG/ML IJ SOLN
INTRAMUSCULAR | Status: AC
  Filled 2017-08-26: qty 1

## 2017-08-26 MED ORDER — MISOPROSTOL 200 MCG PO TABS
1000.0000 ug | ORAL_TABLET | Freq: Once | ORAL | Status: AC
  Administered 2017-08-26: 1000 ug via RECTAL

## 2017-08-26 MED ORDER — DIBUCAINE 1 % RE OINT: 1.0000 "application " | TOPICAL_OINTMENT | RECTAL | Status: DC | PRN

## 2017-08-26 MED ORDER — WITCH HAZEL-GLYCERIN EX PADS: 1.0000 "application " | MEDICATED_PAD | CUTANEOUS | Status: DC | PRN

## 2017-08-26 MED ORDER — ONDANSETRON HCL 4 MG PO TABS: 4.0000 mg | ORAL_TABLET | ORAL | Status: DC | PRN

## 2017-08-26 MED ORDER — MISOPROSTOL 200 MCG PO TABS
ORAL_TABLET | ORAL | Status: AC
  Administered 2017-08-26: 1000 ug via RECTAL
  Filled 2017-08-26: qty 5

## 2017-08-26 MED ORDER — OXYCODONE HCL 5 MG PO TABS
10.0000 mg | ORAL_TABLET | ORAL | Status: DC | PRN
Start: 2017-08-26 — End: 2017-08-27

## 2017-08-26 MED ORDER — ONDANSETRON HCL 4 MG/2ML IJ SOLN: 4.0000 mg | INTRAMUSCULAR | Status: DC | PRN

## 2017-08-26 MED ORDER — BENZOCAINE-MENTHOL 20-0.5 % EX AERO
1.0000 "application " | INHALATION_SPRAY | CUTANEOUS | Status: DC | PRN
  Administered 2017-08-26: 1 via TOPICAL
  Filled 2017-08-26: qty 56

## 2017-08-26 MED ORDER — OXYCODONE HCL 5 MG PO TABS: 5.0000 mg | ORAL_TABLET | ORAL | Status: DC | PRN

## 2017-08-26 MED ORDER — ZOLPIDEM TARTRATE 5 MG PO TABS: 5.0000 mg | ORAL_TABLET | Freq: Every evening | ORAL | Status: DC | PRN

## 2017-08-26 MED ORDER — COCONUT OIL OIL: 1.0000 "application " | TOPICAL_OIL | Status: DC | PRN

## 2017-08-26 MED ORDER — DIPHENHYDRAMINE HCL 25 MG PO CAPS: 25.0000 mg | ORAL_CAPSULE | Freq: Four times a day (QID) | ORAL | Status: DC | PRN

## 2017-08-26 MED ORDER — METHYLERGONOVINE MALEATE 0.2 MG PO TABS: 0.2000 mg | ORAL_TABLET | ORAL | Status: DC | PRN

## 2017-08-26 MED ORDER — IBUPROFEN 600 MG PO TABS
600.0000 mg | ORAL_TABLET | Freq: Four times a day (QID) | ORAL | Status: DC
  Administered 2017-08-26 – 2017-08-27 (×6): 600 mg via ORAL
  Filled 2017-08-26 (×6): qty 1

## 2017-08-26 MED ORDER — FERROUS SULFATE 325 (65 FE) MG PO TABS
325.0000 mg | ORAL_TABLET | Freq: Two times a day (BID) | ORAL | Status: DC
  Administered 2017-08-26 – 2017-08-27 (×3): 325 mg via ORAL
  Filled 2017-08-26 (×3): qty 1

## 2017-08-26 MED ORDER — PRENATAL MULTIVITAMIN CH
1.0000 | ORAL_TABLET | Freq: Every day | ORAL | Status: DC
  Administered 2017-08-26 – 2017-08-27 (×2): 1 via ORAL
  Filled 2017-08-26 (×2): qty 1

## 2017-08-26 MED ORDER — SIMETHICONE 80 MG PO CHEW: 80.0000 mg | CHEWABLE_TABLET | ORAL | Status: DC | PRN

## 2017-08-26 MED ORDER — SENNOSIDES-DOCUSATE SODIUM 8.6-50 MG PO TABS
2.0000 | ORAL_TABLET | ORAL | Status: DC
  Administered 2017-08-26: 2 via ORAL
  Filled 2017-08-26: qty 2

## 2017-08-26 MED ORDER — ACETAMINOPHEN 325 MG PO TABS: 650.0000 mg | ORAL_TABLET | ORAL | Status: DC | PRN

## 2017-08-26 NOTE — Anesthesia Postprocedure Evaluation (Signed)
Anesthesia Post Note  Patient: Beverly Tate  Procedure(s) Performed: * No procedures listed *     Patient location during evaluation: Mother Baby Anesthesia Type: Epidural Level of consciousness: awake and alert and oriented Pain management: satisfactory to patient Vital Signs Assessment: post-procedure vital signs reviewed and stable Respiratory status: spontaneous breathing and nonlabored ventilation Cardiovascular status: stable Postop Assessment: no headache, no backache, no signs of nausea or vomiting, adequate PO intake and patient able to bend at knees (patient up walking) Anesthetic complications: no    Last Vitals:  Vitals:   08/26/17 0258 08/26/17 0641  BP: 134/79 121/67  Pulse: 78 88  Resp: 18 18  Temp: 37.4 C 37.2 C  SpO2:      Last Pain:  Vitals:   08/26/17 0641  TempSrc: Oral  PainSc: 0-No pain   Pain Goal:                 Willa Rough

## 2017-08-26 NOTE — Progress Notes (Signed)
MOB was referred for history of depression/anxiety. * Referral screened out by Clinical Social Worker because none of the following criteria appear to apply: ~ History of anxiety/depression during this pregnancy, or of post-partum depression. ~ Diagnosis of anxiety and/or depression within last 3 years OR * MOB's symptoms currently being treated with medication and/or therapy. MOB has a Rx of Xanax (PRN). Please contact the Clinical Social Worker if needs arise, by [facility] request, or if MOB scores 9 or greater/yes to question 10 on [place] Postpartum Depression Screen.  Laurey Arrow, MSW, LCSW Clinical Social Work [phone]

## 2017-08-27 MED ORDER — IBUPROFEN 600 MG PO TABS: 600.0000 mg | ORAL_TABLET | Freq: Four times a day (QID) | ORAL | 1 refills | Status: AC | PRN

## 2017-08-27 NOTE — Progress Notes (Signed)
Post Partum Day 2 Subjective: no complaints, up ad lib, voiding and tolerating PO  Objective: Blood pressure (!) 122/57, pulse 69, temperature 98.1 F (36.7 C), temperature source Oral, resp. rate 18, height 5\' 9"  (1.753 m), weight 127 kg (280 lb), last menstrual period 11/20/2016, SpO2 100 %, unknown if currently breastfeeding.  Physical Exam:  General: alert, cooperative and no distress Lochia: appropriate Uterine Fundus: firm Incision: NA DVT Evaluation: No evidence of DVT seen on physical exam.   Recent Labs  08/26/17 0053 08/26/17 0547  HGB 12.6 10.7*  HCT 37.7 31.8*    Assessment/Plan: Discharge home and Breastfeeding  D/w pt contraception at postpartum visit  Chronic hypertension continue procardia XL   LOS: 2 days   Beverly Tate J. 08/27/2017, 7:39 AM

## 2017-08-27 NOTE — Discharge Summary (Signed)
OB Discharge Summary     Patient Name: Beverly Tate DOB: [birth year] MRN: [id number]  Date of admission: 08/25/2017 Delivering MD: Christophe Louis   Date of discharge: 08/27/2017  Admitting diagnosis: INDUCTION Intrauterine pregnancy: [redacted]w[redacted]d     Secondary diagnosis:  Active Problems:   Chronic hypertension affecting pregnancy  Additional problems: None     Discharge diagnosis: Term Pregnancy Delivered and CHTN                                                                                                Post partum procedures:NA  Augmentation: Cytotec  Complications: None  Hospital course:  Induction of Labor With Vaginal Delivery   36 y.o. yo G1P1001 at [redacted]w[redacted]d was admitted to the hospital 08/25/2017 for induction of labor.  Indication for induction: chronic hypertension .  Patient had an uncomplicated labor course as follows: Membrane Rupture Time/Date: 6:07 PM ,08/25/2017   Intrapartum Procedures: Episiotomy: None [1]                                         Lacerations:  2nd degree [3]  Patient had delivery of a Viable infant.  Information for the patient's newborn:  Valeri, Sula [[id number]]  Delivery Method: Vaginal, Spontaneous Delivery (Filed from Delivery Summary)   08/25/2017  Details of delivery can be found in separate delivery note.  Patient had a routine postpartum course. Patient is discharged home 08/27/17.  Physical exam  Vitals:   08/26/17 0258 08/26/17 0641 08/26/17 1834 08/27/17 0512  BP: 134/79 121/67 123/63 (!) 122/57  Pulse: 78 88 72 69  Resp: 18 18 20 18   Temp: 99.4 F (37.4 C) 98.9 F (37.2 C) 98 F (36.7 C) 98.1 F (36.7 C)  TempSrc: Oral Oral Oral Oral  SpO2:   100% 100%  Weight:      Height:       General: alert, cooperative and no distress Lochia: appropriate Uterine Fundus: soft Incision: N/A DVT Evaluation: No evidence of DVT seen on physical exam. Labs: Lab Results  Component Value Date   WBC 17.2 (H) 08/26/2017   HGB 10.7 (L)  08/26/2017   HCT 31.8 (L) 08/26/2017   MCV 80.9 08/26/2017   PLT 328 08/26/2017   CMP Latest Ref Rng & Units 08/25/2017  Glucose 65 - 99 mg/dL 102(H)  BUN 6 - 20 mg/dL 9  Creatinine 0.44 - 1.00 mg/dL 0.50  Sodium 135 - 145 mmol/L 138  Potassium 3.5 - 5.1 mmol/L 4.0  Chloride 101 - 111 mmol/L 106  CO2 22 - 32 mmol/L 21(L)  Calcium 8.9 - 10.3 mg/dL 9.4  Total Protein 6.5 - 8.1 g/dL 7.1  Total Bilirubin 0.3 - 1.2 mg/dL 0.1(L)  Alkaline Phos 38 - 126 U/L 172(H)  AST 15 - 41 U/L 18  ALT 14 - 54 U/L 14    Discharge instruction: per After Visit Summary and "Baby and Me Booklet".  After visit meds:  Allergies as of 08/27/2017      Reactions  Sulfamethoxazole-trimethoprim Nausea Only      Medication List    TAKE these medications   acetaminophen 500 MG tablet Commonly known as:  TYLENOL Take 1,000 mg by mouth every 6 (six) hours as needed for mild pain, moderate pain, fever or headache.   calcium carbonate 500 MG chewable tablet Commonly known as:  TUMS - dosed in mg elemental calcium Chew 2 tablets by mouth 4 (four) times daily as needed for indigestion or heartburn.   ferrous sulfate 325 (65 FE) MG tablet Take 325 mg by mouth daily with breakfast.   ibuprofen 600 MG tablet Commonly known as:  ADVIL,MOTRIN Take 1 tablet (600 mg total) by mouth every 6 (six) hours as needed.   loratadine 10 MG tablet Commonly known as:  CLARITIN Take 10 mg by mouth daily as needed for allergies.   NIFEdipine 30 MG 24 hr tablet Commonly known as:  PROCARDIA-XL/ADALAT-CC/NIFEDICAL-XL Take 30 mg by mouth daily.   prenatal multivitamin Tabs tablet Take 1 tablet by mouth daily.            Discharge Care Instructions        Start     Ordered   08/27/17 0000  ibuprofen (ADVIL,MOTRIN) 600 MG tablet  Every 6 hours PRN     08/27/17 0742   08/27/17 0000  Diet - low sodium heart healthy     08/27/17 0742   08/27/17 0000  Call MD for:  temperature >100.4     08/27/17 0742   08/27/17  0000  Call MD for:  persistant nausea and vomiting     08/27/17 0742   08/27/17 0000  Call MD for:  severe uncontrolled pain     08/27/17 0742   08/27/17 0000  Activity as tolerated     08/27/17 0742   08/27/17 0000  Sexual acrtivity    Comments:  Avoid sex for 6 weeks   08/27/17 0742      Diet: low salt diet  Activity: Advance as tolerated. Pelvic rest for 6 weeks.   Outpatient follow up:2 weeks Follow up Appt:No future appointments. Follow up Visit:No Follow-up on file.  Postpartum contraception: Not Discussed  Newborn Data: Live born female  Birth Weight: 6 lb 10 oz (3005 g) APGAR: 8, 9  Baby Feeding: Breast Disposition:home with mother   08/27/2017 Catha Brow., MD

## 2017-08-27 NOTE — Lactation Note (Signed)
This note was copied from a baby's chart. Lactation Consultation Note: This is a first time mother. She has been attempting to breastfeed. Infant has been very sleepy and is not waking to each. Mother is concerns. She was taught to hand express and observed tiny drops of colostrum. Attempt to rouse infant for feeding on the left breast. Mother has erect nipples and compressible tissue. Infant refuses breast with lips clamped down. Allowed infant to suckle on gloved finger and infant tongue thrusting. Infant switched to alternate breast with wake up exercises done and infant showing no feeding cues.. Discussed the use of small amt of formula to stimulate infants appetite. Mother declines at this time.advised mother to offer supplement if unable to get infant to wake for feeding with next feeding. Mother was previously sat up with a DEBP and she has pumped 2 times without obtaining any colostrum. Mother was given a curved tip syringe for supplementing. Lactation brochure given and all breastfeeding teaching done. Mother to page for staff assistance with next feeding attempt.   Patient Name: Beverly Tate AQTMA'U Date: 08/27/2017     Maternal Data    Feeding Feeding Type: Bottle Fed - Formula Nipple Type: Slow - flow  LATCH Score                   Interventions    Lactation Tools Discussed/Used     Consult Status      Darla Lesches 08/27/2017, 8:18 AM

## 2017-08-28 ENCOUNTER — Ambulatory Visit: Payer: Self-pay

## 2017-08-28 NOTE — Lactation Note (Addendum)
This note was copied from a baby's chart. Lactation Consultation Note  Patient Name: Beverly Avy Barlett NRWKE'T Date: 08/28/2017 Reason for consult: 1st time breastfeeding  Mom reports that her breasts feel heavier. Hand expression was taught to Mom & she was able to express some colostrum.   Mom has been pumping q3hrs, but has had no yield. I switched her flanges from a size 27 to a size 24. Mom was also shown how to assemble & use hand pump that was included in pump kit. Mom has a Spectra 2 pump at home. Mom would like me to return to see if infant will latch.   Mom has my # to call for assist w/next feeding.   Per note by LCSW, Mom has an Rx for Xanax (L3).   Matthias Hughs [facility] 08/28/2017, 8:37 AM

## 2017-09-12 ENCOUNTER — Encounter: Payer: Self-pay | Admitting: Family

## 2017-09-14 ENCOUNTER — Inpatient Hospital Stay (HOSPITAL_COMMUNITY)
Admission: AD | Admit: 2017-09-14 | Discharge: 2017-09-17 | DRG: 776 | Disposition: A | Discharge status: Home / Self Care | Payer: 59 | Source: Ambulatory Visit | Attending: Obstetrics and Gynecology | Admitting: Obstetrics and Gynecology

## 2017-09-14 DIAGNOSIS — R109 Unspecified abdominal pain: Secondary | ICD-10-CM | POA: Diagnosis not present

## 2017-09-14 DIAGNOSIS — Z87891 Personal history of nicotine dependence: Secondary | ICD-10-CM | POA: Diagnosis not present

## 2017-09-14 DIAGNOSIS — O8612 Endometritis following delivery: Secondary | ICD-10-CM | POA: Diagnosis present

## 2017-09-14 DIAGNOSIS — O864 Pyrexia of unknown origin following delivery: Secondary | ICD-10-CM | POA: Diagnosis present

## 2017-09-14 LAB — CBC WITH DIFFERENTIAL/PLATELET
Basophils Absolute: 0 10*3/uL (ref 0.0–0.1)
Basophils Relative: 0 %
EOS PCT: 0 %
Eosinophils Absolute: 0 10*3/uL (ref 0.0–0.7)
HCT: 37.5 % (ref 36.0–46.0)
HEMOGLOBIN: 12.6 g/dL (ref 12.0–15.0)
LYMPHS ABS: 1.2 10*3/uL (ref 0.7–4.0)
LYMPHS PCT: 9 %
MCH: 27.4 pg (ref 26.0–34.0)
MCHC: 33.6 g/dL (ref 30.0–36.0)
MCV: 81.5 fL (ref 78.0–100.0)
MONOS PCT: 2 %
Monocytes Absolute: 0.2 10*3/uL (ref 0.1–1.0)
NEUTROS PCT: 89 %
Neutro Abs: 12 10*3/uL — ABNORMAL HIGH (ref 1.7–7.7)
Platelets: 332 10*3/uL (ref 150–400)
RBC: 4.6 MIL/uL (ref 3.87–5.11)
RDW: 15.6 % — ABNORMAL HIGH (ref 11.5–15.5)
WBC: 13.4 10*3/uL — AB (ref 4.0–10.5)

## 2017-09-14 LAB — COMPREHENSIVE METABOLIC PANEL
ALBUMIN: 3.4 g/dL — AB (ref 3.5–5.0)
ALK PHOS: 96 U/L (ref 38–126)
ALT: 16 U/L (ref 14–54)
ANION GAP: 10 (ref 5–15)
AST: 22 U/L (ref 15–41)
BILIRUBIN TOTAL: 0.3 mg/dL (ref 0.3–1.2)
BUN: 15 mg/dL (ref 6–20)
CALCIUM: 8.9 mg/dL (ref 8.9–10.3)
CO2: 20 mmol/L — AB (ref 22–32)
CREATININE: 0.9 mg/dL (ref 0.44–1.00)
Chloride: 109 mmol/L (ref 101–111)
GFR calc Af Amer: >60 mL/min (ref >60)
GFR calc non Af Amer: >60 mL/min (ref >60)
GLUCOSE: 119 mg/dL — AB (ref 65–99)
Potassium: 3.8 mmol/L (ref 3.5–5.1)
SODIUM: 139 mmol/L (ref 135–145)
TOTAL PROTEIN: 6.8 g/dL (ref 6.5–8.1)

## 2017-09-14 LAB — LACTIC ACID, PLASMA
Lactic Acid, Venous: 1.2 mmol/L (ref 0.5–1.9)
Lactic Acid, Venous: 1.8 mmol/L (ref 0.5–1.9)

## 2017-09-14 LAB — INFLUENZA PANEL BY PCR (TYPE A & B)
INFLAPCR: NEGATIVE
Influenza B By PCR: NEGATIVE

## 2017-09-14 MED ORDER — ZOLPIDEM TARTRATE 5 MG PO TABS: 5.0000 mg | ORAL_TABLET | Freq: Every evening | ORAL | Status: DC | PRN

## 2017-09-14 MED ORDER — GENTAMICIN SULFATE 40 MG/ML IJ SOLN: 1.5000 mg/kg | Freq: Three times a day (TID) | INTRAVENOUS | Status: DC

## 2017-09-14 MED ORDER — PRENATAL MULTIVITAMIN CH
1.0000 | ORAL_TABLET | Freq: Every day | ORAL | Status: DC
  Administered 2017-09-15 – 2017-09-16 (×2): 1 via ORAL
  Filled 2017-09-14 (×2): qty 1

## 2017-09-14 MED ORDER — NIFEDIPINE ER OSMOTIC RELEASE 30 MG PO TB24
30.0000 mg | ORAL_TABLET | Freq: Every day | ORAL | Status: DC
  Administered 2017-09-15 – 2017-09-16 (×2): 30 mg via ORAL
  Filled 2017-09-14 (×2): qty 1

## 2017-09-14 MED ORDER — LACTATED RINGERS IV SOLN
INTRAVENOUS | Status: DC
  Administered 2017-09-14 – 2017-09-16 (×3): via INTRAVENOUS

## 2017-09-14 MED ORDER — GENTAMICIN SULFATE 40 MG/ML IJ SOLN
Freq: Three times a day (TID) | INTRAVENOUS | Status: DC
  Administered 2017-09-14 – 2017-09-17 (×8): via INTRAVENOUS
  Filled 2017-09-14 (×9): qty 2.75

## 2017-09-14 MED ORDER — FERROUS SULFATE 325 (65 FE) MG PO TABS: 325.0000 mg | ORAL_TABLET | Freq: Every day | ORAL | Status: DC

## 2017-09-14 MED ORDER — LORATADINE 10 MG PO TABS
10.0000 mg | ORAL_TABLET | Freq: Every day | ORAL | Status: DC | PRN
  Filled 2017-09-14: qty 1

## 2017-09-14 MED ORDER — ACETAMINOPHEN 500 MG PO TABS
1000.0000 mg | ORAL_TABLET | Freq: Four times a day (QID) | ORAL | Status: DC | PRN
  Administered 2017-09-14 – 2017-09-15 (×2): 1000 mg via ORAL
  Filled 2017-09-14 (×2): qty 2

## 2017-09-14 MED ORDER — IBUPROFEN 600 MG PO TABS
600.0000 mg | ORAL_TABLET | Freq: Four times a day (QID) | ORAL | Status: DC | PRN
  Administered 2017-09-15 (×2): 600 mg via ORAL
  Filled 2017-09-14 (×2): qty 1

## 2017-09-14 MED ORDER — CLINDAMYCIN PHOSPHATE 900 MG/50ML IV SOLN
900.0000 mg | Freq: Three times a day (TID) | INTRAVENOUS | Status: DC
Start: 2017-09-14 — End: 2017-09-14

## 2017-09-14 NOTE — MAU Note (Signed)
Chief Complaint: Chills; Fatigue; and Abdominal Pain   None    SUBJECTIVE HPI: Beverly Tate is a 36 y.o. G1P1001 who had an uncomplicated SVD on 8/92/1194 of baby girl.  Pt  presents to Maternity Admissions reporting feeling sick and having a fever.  Pt states her pregnancy was complicated by [facility] and GBS positive.  Pt was treated for GBS.  Pt currently on Procardia 30mg  XL PO daily. Pt states started feeling bad yesterday after running errands.  Pt taking Motrin 600mg  for fever with last dose at 1800.  Pt complains of slight uterine cramping.  Denies pain with urination or breast tenderness.  Pt pumping and feeding baby breast milk from bottle.  Location: lower uterine tenderness Quality: cramping Severity: 5/10 on pain scale Duration: since last week.  Timing: all day Modifying factors: Motrin Associated signs and symptoms: Tired with body aches.  Past Medical History:  Diagnosis Date  . Allergic rhinitis   . Anxiety   . Chronic hypertension affecting pregnancy 08/25/2017  . Complication of anesthesia    disorriented and chills when woke up  . Fibroid   . HPV test positive    OB History  Gravida Para Term Preterm AB Living  1 1 1  0 0 1  SAB TAB Ectopic Multiple Live Births  0 0 0 0 1    # Outcome Date GA Lbr Len/2nd Weight Sex Delivery Anes PTL Lv  1 Term 08/25/17 [redacted]w[redacted]d 05:33 / 00:18 3.005 kg (6 lb 10 oz) F Vag-Spont Spinal  LIV     Past Surgical History:  Procedure Laterality Date  . KNEE SURGERY Left 07/2002   ACL repair   Social History   Social History  . Marital status: Married    Spouse name: N/A  . Number of children: N/A  . Years of education: N/A   Occupational History  . Not on file.   Social History Main Topics  . Smoking status: Former Smoker    Packs/day: 1.00    Years: 5.00    Types: Cigarettes  . Smokeless tobacco: Never Used  . Alcohol use 0.6 - 1.2 oz/week    1 - 2 Standard drinks or equivalent per week  . Drug use: No  . Sexual activity:  Yes    Partners: Male   Other Topics Concern  . Not on file   Social History Narrative   Grew up in [place]   Parents moved to [place] to retire   Works for [employer]   Completed bachelors degree   No children, dog, has boyfriend, lives alone      Family History  Problem Relation Age of Onset  . Colon cancer Maternal Grandfather 68  . Hypertension Mother   . Thyroid disease Mother   . Anemia Mother   . Diabetes Mother   . Hypertension Father   . Hepatitis C Father   . Varicose Veins Father   . Diabetes Father   . Diabetes Maternal Grandmother   . Diabetes Paternal Grandmother   . Anemia Sister   . Sarcoidosis Sister    No current facility-administered medications on file prior to encounter.    Current Outpatient Prescriptions on File Prior to Encounter  Medication Sig Dispense Refill  . acetaminophen (TYLENOL) 500 MG tablet Take 1,000 mg by mouth every 6 (six) hours as needed for mild pain, moderate pain, fever or headache.    . calcium carbonate (TUMS - DOSED IN MG ELEMENTAL CALCIUM) 500 MG chewable tablet Chew 2 tablets  by mouth 4 (four) times daily as needed for indigestion or heartburn.     . ferrous sulfate 325 (65 FE) MG tablet Take 325 mg by mouth daily with breakfast.    . ibuprofen (ADVIL,MOTRIN) 600 MG tablet Take 1 tablet (600 mg total) by mouth every 6 (six) hours as needed. 30 tablet 1  . loratadine (CLARITIN) 10 MG tablet Take 10 mg by mouth daily as needed for allergies.    Marland Kitchen NIFEdipine (PROCARDIA-XL/ADALAT-CC/NIFEDICAL-XL) 30 MG 24 hr tablet Take 30 mg by mouth daily.    . Prenatal Vit-Fe Fumarate-FA (PRENATAL MULTIVITAMIN) TABS tablet Take 1 tablet by mouth daily.     Allergies  Allergen Reactions  . Sulfamethoxazole-Trimethoprim Nausea Only    I have reviewed patient's Past Medical Hx, Surgical Hx, Family Hx, Social Hx, medications and allergies.   Review of Systems  Constitutional: Positive for fatigue and fever.  HENT: Negative.   Eyes: Negative.    Respiratory: Negative.   Cardiovascular: Negative.   Gastrointestinal: Positive for abdominal pain.  Endocrine: Negative.   Genitourinary: Positive for vaginal bleeding.  Musculoskeletal: Negative.   Allergic/Immunologic: Negative.   Neurological: Negative.   Hematological: Negative.   Psychiatric/Behavioral: Negative.     OBJECTIVE Patient Vitals for the past 24 hrs:  BP Temp Pulse Resp  09/14/17 1858 128/86 (!) 103.1 F (39.5 C) (!) 152 18   Constitutional: Well-developed, well-nourished female in no acute distress.  Cardiovascular: tachycardia regular rythym Respiratory: normal rate and effort.  GI: Abd soft, slight tenderness noted, uterus involuted. Pos BS x 4 MS: Extremities nontender, no edema, normal ROM Neurologic: Alert and oriented x 4.  GU: Neg CVAT.  SPECULUM EXAM: NEFG, small amount of blood noted, membranes noted at cervix opening teased out with ring forceps  BIMANUAL: cervix no CMT; uterus involuted to 3 weeks postpartum, tender to palpation, no adnexal tenderness or masses.    LAB RESULTS   IMAGING No results found.  MAU COURSE Orders Placed This Encounter  Procedures  . Culture, blood (routine x 2)  . CBC with Differential/Platelet  . Comprehensive metabolic panel  . Lactic acid, plasma  . Perform PNA/Flu Screening  . EKG 12-Lead  . EKG 12-Lead   Meds ordered this encounter  Medications  . acetaminophen (TYLENOL) tablet 1,000 mg    MDM PE, IV antibiotics, lab, admission ASSESSMENT Postpartum endometritis  PLAN Admit to hospital. Consult with Dr. Raphael Gibney.     Starla Link, CNM 09/14/2017  8:11 PM

## 2017-09-14 NOTE — H&P (Signed)
Beverly Tate is an 36 y.o. female, presenting with c/o cramping and fever.  She is a G1P1001 who had an uncomplicated SVD on 8/85/0277 of baby girl.  Pt  presents to Maternity Admissions reporting feeling sick and having a fever.  Pt states her pregnancy was complicated by [facility] and GBS positive.  Pt was treated for GBS.  Pt currently on Procardia 30mg  XL PO daily. Pt states started feeling bad yesterday after running errands.  Pt taking Motrin 600mg  for fever with last dose at 1800.  Pt complains of slight uterine cramping.  Denies pain with urination or breast tenderness.  Pt pumping and feeding baby breast milk from bottle.  Patient Active Problem List   Diagnosis Date Noted  . Chronic hypertension affecting pregnancy 08/25/2017  . Preventative health care 01/02/2016  . Anxiety 11/06/2014  . OCD (obsessive compulsive disorder) 11/06/2014  . Allergic rhinitis 11/06/2014  . Fibroid   . Adult body mass index 37.0-37.9 12/23/2012    Pertinent Gynecological History: OB History: G1, P1001 Delivery on 08/25/2017 female infant  MEDICAL/FAMILY/SOCIAL HX: No LMP recorded.    Past Medical History:  Diagnosis Date  . Allergic rhinitis   . Anxiety   . Chronic hypertension affecting pregnancy 08/25/2017  . Complication of anesthesia    disorriented and chills when woke up  . Fibroid   . HPV test positive     Past Surgical History:  Procedure Laterality Date  . KNEE SURGERY Left 07/2002   ACL repair    Family History  Problem Relation Age of Onset  . Colon cancer Maternal Grandfather 28  . Hypertension Mother   . Thyroid disease Mother   . Anemia Mother   . Diabetes Mother   . Hypertension Father   . Hepatitis C Father   . Varicose Veins Father   . Diabetes Father   . Diabetes Maternal Grandmother   . Diabetes Paternal Grandmother   . Anemia Sister   . Sarcoidosis Sister     Social History:  reports that she has quit smoking. Her smoking use included Cigarettes. She has a 5.00  pack-year smoking history. She has never used smokeless tobacco. She reports that she drinks about 0.6 - 1.2 oz of alcohol per week . She reports that she does not use drugs.  ALLERGIES/MEDS:  Allergies:  Allergies  Allergen Reactions  . Sulfamethoxazole-Trimethoprim Nausea Only    Prescriptions Prior to Admission  Medication Sig Dispense Refill Last Dose  . acetaminophen (TYLENOL) 500 MG tablet Take 1,000 mg by mouth every 6 (six) hours as needed for mild pain, moderate pain, fever or headache.   09/14/2017  . calcium carbonate (TUMS - DOSED IN MG ELEMENTAL CALCIUM) 500 MG chewable tablet Chew 2 tablets by mouth 4 (four) times daily as needed for indigestion or heartburn.    09/14/2017  . ferrous sulfate 325 (65 FE) MG tablet Take 325 mg by mouth daily with breakfast.   09/14/2017  . ibuprofen (ADVIL,MOTRIN) 600 MG tablet Take 1 tablet (600 mg total) by mouth every 6 (six) hours as needed. 30 tablet 1 09/14/2017  . loratadine (CLARITIN) 10 MG tablet Take 10 mg by mouth daily as needed for allergies.   09/14/2017  . NIFEdipine (PROCARDIA-XL/ADALAT-CC/NIFEDICAL-XL) 30 MG 24 hr tablet Take 30 mg by mouth daily.   09/14/2017  . Prenatal Vit-Fe Fumarate-FA (PRENATAL MULTIVITAMIN) TABS tablet Take 1 tablet by mouth daily.   09/14/2017     Review of Systems  Constitutional: Positive for chills and fever.  HENT: Negative.   Eyes: Negative.   Respiratory: Negative.   Cardiovascular: Negative.   Gastrointestinal: Positive for abdominal pain.  Genitourinary: Negative.        Small amount of lochia  Musculoskeletal: Negative.   Skin: Negative.   Neurological: Negative.   Endo/Heme/Allergies: Negative.   Psychiatric/Behavioral: Negative.     Blood pressure 128/86, pulse (!) 152, temperature (!) 102.3 F (39.1 C), temperature source Oral, resp. rate 18, unknown if currently breastfeeding. Physical Exam  Constitutional: She appears well-developed and well-nourished.  HENT:  Head: Normocephalic.   Neck: Normal range of motion. Neck supple. No thyromegaly present.  Cardiovascular: Regular rhythm.   tachycardia  Respiratory: Effort normal and breath sounds normal.  GI: Soft. Bowel sounds are normal. There is tenderness.  Genitourinary: Vagina normal.  Genitourinary Comments: BUS wnl Vagina no lesions Cervix membranes noted at opening of cervix removed with ring forceps Bimanual  No CMT, uterus involuted but tender No adnexa masses or tenderness  Musculoskeletal: Normal range of motion. She exhibits no edema.  Lymphadenopathy:    She has no cervical adenopathy.  Skin: Skin is warm and dry.  Psychiatric: She has a normal mood and affect.    No results found for this or any previous visit (from the past 24 hour(s)).  No results found.   ASSESSMENT: Postpartum endometritis S/P NSVD on 08/25/17  PLAN: Admit,consult Dr. Raphael Gibney IV antibiotics, pelvic US in am   [place] 09/14/2017, 8:49 PM

## 2017-09-14 NOTE — MAU Note (Signed)
Patient had vaginal birth on 9/10, having lower abdominal pain, chills, temperature 103, pumping

## 2017-09-14 NOTE — Progress Notes (Addendum)
PROGRESS NOTE  I have reviewed the patient's vital signs, labs, and notes. I have examined the patient. I agree with the previous note from the Certified Nurse Midwife.  BP 128/86 (BP Location: Left Arm)   Pulse (!) 152   Temp 98.2 F (36.8 C)   Resp 18   Wt 72.6 kg (160 lb)   BMI 23.63 kg/m   Results for orders placed or performed during the hospital encounter of 09/14/17 (from the past 24 hour(s))  Influenza panel by PCR (type A & B)     Status: None   Collection Time: 09/14/17  8:12 PM  Result Value Ref Range   Influenza A By PCR NEGATIVE NEGATIVE   Influenza B By PCR NEGATIVE NEGATIVE  CBC with Differential/Platelet     Status: Abnormal   Collection Time: 09/14/17  8:27 PM  Result Value Ref Range   WBC 13.4 (H) 4.0 - 10.5 K/uL   RBC 4.60 3.87 - 5.11 MIL/uL   Hemoglobin 12.6 12.0 - 15.0 g/dL   HCT 37.5 36.0 - 46.0 %   MCV 81.5 78.0 - 100.0 fL   MCH 27.4 26.0 - 34.0 pg   MCHC 33.6 30.0 - 36.0 g/dL   RDW 15.6 (H) 11.5 - 15.5 %   Platelets 332 150 - 400 K/uL   Neutrophils Relative % 89 %   Neutro Abs 12.0 (H) 1.7 - 7.7 K/uL   Lymphocytes Relative 9 %   Lymphs Abs 1.2 0.7 - 4.0 K/uL   Monocytes Relative 2 %   Monocytes Absolute 0.2 0.1 - 1.0 K/uL   Eosinophils Relative 0 %   Eosinophils Absolute 0.0 0.0 - 0.7 K/uL   Basophils Relative 0 %   Basophils Absolute 0.0 0.0 - 0.1 K/uL  Comprehensive metabolic panel     Status: Abnormal   Collection Time: 09/14/17  8:27 PM  Result Value Ref Range   Sodium 139 135 - 145 mmol/L   Potassium 3.8 3.5 - 5.1 mmol/L   Chloride 109 101 - 111 mmol/L   CO2 20 (L) 22 - 32 mmol/L   Glucose, Bld 119 (H) 65 - 99 mg/dL   BUN 15 6 - 20 mg/dL   Creatinine, Ser 0.90 0.44 - 1.00 mg/dL   Calcium 8.9 8.9 - 10.3 mg/dL   Total Protein 6.8 6.5 - 8.1 g/dL   Albumin 3.4 (L) 3.5 - 5.0 g/dL   AST 22 15 - 41 U/L   ALT 16 14 - 54 U/L   Alkaline Phosphatase 96 38 - 126 U/L   Total Bilirubin 0.3 0.3 - 1.2 mg/dL   GFR calc non Af Amer >60 >60 mL/min    GFR calc Af Amer >60 >60 mL/min   Anion gap 10 5 - 15  Lactic acid, plasma     Status: None   Collection Time: 09/14/17  8:27 PM  Result Value Ref Range   Lactic Acid, Venous 1.2 0.5 - 1.9 mmol/L   At the moment, the patient states that she feels fine. No evidence of sepsis on exam (no evidence of acute organ failure).   We have ordered an [place] for in the morning. We will make the patient NPO after midnight. Her Obstetrician may want to do a D and C if POC's are found.  Gildardo Cranker, M.D. 09/14/2017

## 2017-09-15 ENCOUNTER — Encounter (HOSPITAL_COMMUNITY): Payer: Self-pay

## 2017-09-15 ENCOUNTER — Inpatient Hospital Stay (HOSPITAL_COMMUNITY): Payer: 59

## 2017-09-15 NOTE — Progress Notes (Signed)
HD #2 PPD #21 s/p SVD. Pt admitted with endometritis .   Subjective: pt reports feeling better. Still has some pelvic pain. Minimal lochia. She would like to eat.   Vitals:   09/15/17 0922 09/15/17 1129  BP: 110/66 117/72  Pulse: 99 74  Resp: 16 16  Temp: 98.4 F (36.9 C) 98.5 F (36.9 C)  SpO2: 100% 100%     General alert and oriented NAD.  Lungs: normal effort no distress  Abdomen Soft tender to mild palpation suprapubicly and right lower quadrant.  Ext. Trace edema  Pysch: appropriate affect.   Results for orders placed or performed during the hospital encounter of 09/14/17 (from the past 24 hour(s))  Influenza panel by PCR (type A & B)     Status: None   Collection Time: 09/14/17  8:12 PM  Result Value Ref Range   Influenza A By PCR NEGATIVE NEGATIVE   Influenza B By PCR NEGATIVE NEGATIVE  CBC with Differential/Platelet     Status: Abnormal   Collection Time: 09/14/17  8:27 PM  Result Value Ref Range   WBC 13.4 (H) 4.0 - 10.5 K/uL   RBC 4.60 3.87 - 5.11 MIL/uL   Hemoglobin 12.6 12.0 - 15.0 g/dL   HCT 37.5 36.0 - 46.0 %   MCV 81.5 78.0 - 100.0 fL   MCH 27.4 26.0 - 34.0 pg   MCHC 33.6 30.0 - 36.0 g/dL   RDW 15.6 (H) 11.5 - 15.5 %   Platelets 332 150 - 400 K/uL   Neutrophils Relative % 89 %   Neutro Abs 12.0 (H) 1.7 - 7.7 K/uL   Lymphocytes Relative 9 %   Lymphs Abs 1.2 0.7 - 4.0 K/uL   Monocytes Relative 2 %   Monocytes Absolute 0.2 0.1 - 1.0 K/uL   Eosinophils Relative 0 %   Eosinophils Absolute 0.0 0.0 - 0.7 K/uL   Basophils Relative 0 %   Basophils Absolute 0.0 0.0 - 0.1 K/uL  Comprehensive metabolic panel     Status: Abnormal   Collection Time: 09/14/17  8:27 PM  Result Value Ref Range   Sodium 139 135 - 145 mmol/L   Potassium 3.8 3.5 - 5.1 mmol/L   Chloride 109 101 - 111 mmol/L   CO2 20 (L) 22 - 32 mmol/L   Glucose, Bld 119 (H) 65 - 99 mg/dL   BUN 15 6 - 20 mg/dL   Creatinine, Ser 0.90 0.44 - 1.00 mg/dL   Calcium 8.9 8.9 - 10.3 mg/dL   Total Protein  6.8 6.5 - 8.1 g/dL   Albumin 3.4 (L) 3.5 - 5.0 g/dL   AST 22 15 - 41 U/L   ALT 16 14 - 54 U/L   Alkaline Phosphatase 96 38 - 126 U/L   Total Bilirubin 0.3 0.3 - 1.2 mg/dL   GFR calc non Af Amer >60 >60 mL/min   GFR calc Af Amer >60 >60 mL/min   Anion gap 10 5 - 15  Lactic acid, plasma     Status: None   Collection Time: 09/14/17  8:27 PM  Result Value Ref Range   Lactic Acid, Venous 1.2 0.5 - 1.9 mmol/L  Lactic acid, plasma     Status: None   Collection Time: 09/14/17 11:00 PM  Result Value Ref Range   Lactic Acid, Venous 1.8 0.5 - 1.9 mmol/L   .Ultrasound no evidence of retained products.   A/P HD #2 Endometritis./Improving clinically  -Continue Gent and Clinda until Afebrile greater than 24 hours.  -  follow urine and blood cutlures.  - Advance diet -Activity as tolerated.

## 2017-09-16 ENCOUNTER — Telehealth: Payer: Self-pay

## 2017-09-16 NOTE — Progress Notes (Signed)
HD #3 PPD #22 s/p SVD. Pt admitted with endometritis .   Subjective: pt reports feeling better.  Pain has almost completely resolved. She is anxious to get home to her newborn.  . Vitals:   09/16/17 0800 09/16/17 1200 09/16/17 1558 09/16/17 1954  BP: 111/72 112/76 (!) 114/57 127/81  Pulse: 74 79 71 75  Resp: 18 18 18 18   Temp: 98.2 F (36.8 C) 97.9 F (36.6 C) 98.5 F (36.9 C) 98.2 F (36.8 C)  TempSrc: Oral Oral Oral Oral  SpO2: 100%  100% 99%  Weight:      Height:       General alert and oriented NAD.  Lungs: normal effort no distress  Abdomen Soft slight tenderness in the left lower quadrant no rebound no guarding.  Ext. Trace edema  Pysch: appropriate affect.    Marland KitchenUltrasound no evidence of retained products.   A/P HD #3 Endometritis./Improving clinically  -Continue Gent and Clinda until Afebrile greater than 48 hours.  - follow urine and blood cutlures.  -Activity as tolerated.

## 2017-09-17 NOTE — Progress Notes (Addendum)
HD #4 PPD #23 s/p SVD. Pt admitted with endometritis .   Subjective: pain has resolved. Feels "Good" . Vitals:   09/16/17 1558 09/16/17 1954 09/17/17 0047 09/17/17 0607  BP: (!) 114/57 127/81 133/84 132/79  Pulse: 71 75 68 65  Resp: 18 18 16 16   Temp: 98.5 F (36.9 C) 98.2 F (36.8 C) 98.6 F (37 C) 97.9 F (36.6 C)  TempSrc: Oral Oral Oral Oral  SpO2: 100% 99% 99% 100%  Weight:      Height:       General alert and oriented NAD.  Lungs: normal effort no distress  Abdomen Soft no uterine tenderness slight tendernss in left lower quadrant with deep palpation but not over uterus .Marland Kitchen Ext. Trace edema  Pysch: appropriate affect.    Marland KitchenUltrasound no evidence of retained products.   A/P HD #3 Endometritis.- pain resolved pt has been afebrile for 48 hours. - blood cultures negative thus far  D/C abx  D/C home follow up in office at [place]

## 2017-09-17 NOTE — Progress Notes (Signed)
Teaching complete  Pt ambulated out with baby in car seat

## 2017-09-20 LAB — CULTURE, BLOOD (ROUTINE X 2)
CULTURE: NO GROWTH
CULTURE: NO GROWTH
SPECIAL REQUESTS: ADEQUATE
Special Requests: ADEQUATE

## 2017-09-26 ENCOUNTER — Telehealth: Payer: Self-pay | Admitting: *Deleted

## 2017-09-26 NOTE — Telephone Encounter (Signed)
Received request for Medical Records from [facility]; forwarded to [place] for email/scana/SLS 10/12

## 2017-09-30 NOTE — Discharge Summary (Signed)
Physician Discharge Summary  Patient ID: Beverly Tate MRN: [id number] DOB/AGE: [birth year] 34 y.o.  Admit date: 09/14/2017 Discharge date: 09/17/2017 Admission Diagnoses: Postpartum Endometritis   Discharge Diagnoses:  Active Problems:   Postpartum endometritis   Discharged Condition: good  Hospital Course: pt presented to the hospital complaining of pelvic pain and fever. She had a temp of 103 on arrival. She was diagnosed with postpartum endometritus. Blood cultures were drawn. Gentamicin and Clindamycin were ordered. Pt improved clinically. She was discharged once afebrile for greater than 48 hours .   Consults: None  Significant Diagnostic Studies: labs: Blood cultures negative... Ultrasound negative for retained products of conception.   Treatments: antibiotics: gentamycin and clindamycin  Discharge Exam: Blood pressure 118/69, pulse 66, temperature 97.7 F (36.5 C), temperature source Oral, resp. rate 16, height 5\' 9"  (1.753 m), weight 72.6 kg (160 lb), SpO2 100 %, unknown if currently breastfeeding. General appearance: alert, cooperative and no distress  Disposition: 01-Home or Self Care  Discharge Instructions    Activity as tolerated    Complete by:  As directed    Call MD for:  persistant nausea and vomiting    Complete by:  As directed    Call MD for:  severe uncontrolled pain    Complete by:  As directed    Call MD for:  temperature >100.4    Complete by:  As directed    Diet general    Complete by:  As directed    Sexual acrtivity    Complete by:  As directed    Avoid sex for 6 weeks     Allergies as of 09/17/2017      Reactions   Sulfamethoxazole-trimethoprim Nausea Only      Medication List    TAKE these medications   acetaminophen 500 MG tablet Commonly known as:  TYLENOL Take 1,000 mg by mouth every 6 (six) hours as needed for mild pain, moderate pain, fever or headache.   calcium carbonate 500 MG chewable tablet Commonly known as:  TUMS -  dosed in mg elemental calcium Chew 2 tablets by mouth 4 (four) times daily as needed for indigestion or heartburn.   ferrous sulfate 325 (65 FE) MG tablet Take 325 mg by mouth daily with breakfast.   ibuprofen 600 MG tablet Commonly known as:  ADVIL,MOTRIN Take 1 tablet (600 mg total) by mouth every 6 (six) hours as needed.   loratadine 10 MG tablet Commonly known as:  CLARITIN Take 10 mg by mouth daily as needed for allergies.   NIFEdipine 30 MG 24 hr tablet Commonly known as:  PROCARDIA-XL/ADALAT-CC/NIFEDICAL-XL Take 30 mg by mouth daily.   prenatal multivitamin Tabs tablet Take 1 tablet by mouth daily.      Follow-up Information    Christophe Louis, MD Follow up in 3 week(s).   Specialty:  Obstetrics and Gynecology Why:  postpartum visit. pt already has an appointment  Contact information: [address]. [address] [place] [address] [phone]           Signed: Catha Brow. 09/30/2017, 10:22 PM

## 2017-10-06 ENCOUNTER — Ambulatory Visit (INDEPENDENT_AMBULATORY_CARE_PROVIDER_SITE_OTHER): Payer: 59 | Admitting: Podiatry

## 2017-10-06 ENCOUNTER — Encounter: Payer: Self-pay | Admitting: Podiatry

## 2017-10-06 DIAGNOSIS — L600 Ingrowing nail: Secondary | ICD-10-CM | POA: Diagnosis not present

## 2017-10-06 NOTE — Patient Instructions (Signed)
Ingrown Toenail An ingrown toenail occurs when the corner or sides of your toenail grow into the surrounding skin. The big toe is most commonly affected, but it can happen to any of your toes. If your ingrown toenail is not treated, you will be at risk for infection. What are the causes? This condition may be caused by:  Wearing shoes that are too small or tight.  Injury or trauma, such as stubbing your toe or having your toe stepped on.  Improper cutting or care of your toenails.  Being born with (congenital) nail or foot abnormalities, such as having a nail that is too big for your toe.  What increases the risk? Risk factors for an ingrown toenail include:  Age. Your nails tend to thicken as you get older, so ingrown nails are more common in older people.  Diabetes.  Cutting your toenails incorrectly.  Blood circulation problems.  What are the signs or symptoms? Symptoms may include:  Pain, soreness, or tenderness.  Redness.  Swelling.  Hardening of the skin surrounding the toe.  Your ingrown toenail may be infected if there is fluid, pus, or drainage. How is this diagnosed? An ingrown toenail may be diagnosed by medical history and physical exam. If your toenail is infected, your health care provider may test a sample of the drainage. How is this treated? Treatment depends on the severity of your ingrown toenail. Some ingrown toenails may be treated at home. More severe or infected ingrown toenails may require surgery to remove all or part of the nail. Infected ingrown toenails may also be treated with antibiotic medicines. Follow these instructions at home:  If you were prescribed an antibiotic medicine, finish all of it even if you start to feel better.  Soak your foot in warm soapy water for 20 minutes, 3 times per day or as directed by your health care provider.  Carefully lift the edge of the nail away from the sore skin by wedging a small piece of cotton under  the corner of the nail. This may help with the pain. Be careful not to cause more injury to the area.  Wear shoes that fit well. If your ingrown toenail is causing you pain, try wearing sandals, if possible.  Trim your toenails regularly and carefully. Do not cut them in a curved shape. Cut your toenails straight across. This prevents injury to the skin at the corners of the toenail.  Keep your feet clean and dry.  If you are having trouble walking and are given crutches by your health care provider, use them as directed.  Do not pick at your toenail or try to remove it yourself.  Take medicines only as directed by your health care provider.  Keep all follow-up visits as directed by your health care provider. This is important. Contact a health care provider if:  Your symptoms do not improve with treatment. Get help right away if:  You have red streaks that start at your foot and go up your leg.  You have a fever.  You have increased redness, swelling, or pain.  You have fluid, blood, or pus coming from your toenail. This information is not intended to replace advice given to you by your health care provider. Make sure you discuss any questions you have with your health care provider. Document Released: 11/29/2000 Document Revised: 05/03/2016 Document Reviewed: 10/26/2014 Elsevier Interactive Patient Education  2018 [employer]  

## 2017-10-08 NOTE — Progress Notes (Signed)
   Subjective: Patient presents today for evaluation of pain to the medial and lateral borders of the left great toe that began about one week ago. Patient is concerned for possible ingrown nail and states she has had the avulsion procedure done two times previously. Patient presents today for further treatment and evaluation.   Past Medical History:  Diagnosis Date  . Allergic rhinitis   . Anxiety   . Chronic hypertension affecting pregnancy 08/25/2017  . Complication of anesthesia    disorriented and chills when woke up  . Fibroid   . HPV test positive     Objective:  General: Well developed, nourished, in no acute distress, alert and oriented x3   Dermatology: Skin is warm, dry and supple bilateral. Medial and lateral borders of the left great toe appears to be erythematous with evidence of an ingrowing nail. Pain on palpation noted to the border of the nail fold. The remaining nails appear unremarkable at this time. There are no open sores, lesions.  Vascular: Dorsalis Pedis artery and Posterior Tibial artery pedal pulses palpable. No lower extremity edema noted.   Neruologic: Grossly intact via light touch bilateral.  Musculoskeletal: Muscular strength within normal limits in all groups bilateral. Normal range of motion noted to all pedal and ankle joints.   Assesement: #1 Paronychia with ingrowing nail medial and lateral borders of the left great toe #2 Pain in toe #3 Incurvated nail  Plan of Care:  1. Patient evaluated.  2. Discussed treatment alternatives and plan of care. Explained nail avulsion procedure and post procedure course to patient. 3. Patient opted for permanent partial nail avulsion.  4. Prior to procedure, local anesthesia infiltration utilized using 3 ml of a 50:50 mixture of 2% plain lidocaine and 0.5% plain marcaine in a normal hallux block fashion and a betadine prep performed.  5. Partial permanent nail avulsion with chemical matrixectomy performed using  [id number] applications of phenol followed by alcohol flush.  6. Light dressing applied. 7. Return to clinic in 2 weeks.   Edrick Kins, DPM [facility]  Dr. Edrick Kins, [address]                                        [place], [place] [address]                Office [phone]  Fax [phone]

## 2017-10-15 IMAGING — US US TRANSVAGINAL NON-OB
1 series · 14 of 25 positions shown · non-contrast
Comparison: 09/18/2015 CT abdomen/ pelvis.

CLINICAL DATA: 35-year-old female status post uncomplicated
spontaneous vaginal delivery 08/25/2017, presenting with reported
postpartum endometritis with cramping and fever.

EXAM:
TRANSABDOMINAL AND TRANSVAGINAL ULTRASOUND OF PELVIS
TECHNIQUE: Both transabdominal and transvaginal ultrasound examinations of the
pelvis were performed. Transabdominal technique was performed for
global imaging of the pelvis including uterus, ovaries, adnexal
regions, and pelvic cul-de-sac. It was necessary to proceed with
endovaginal exam following the transabdominal exam to visualize the
endometrium and adnexa.

[Series 1: us transvaginal non-ob · 14 of 57 slices shown]
[im 1/57]
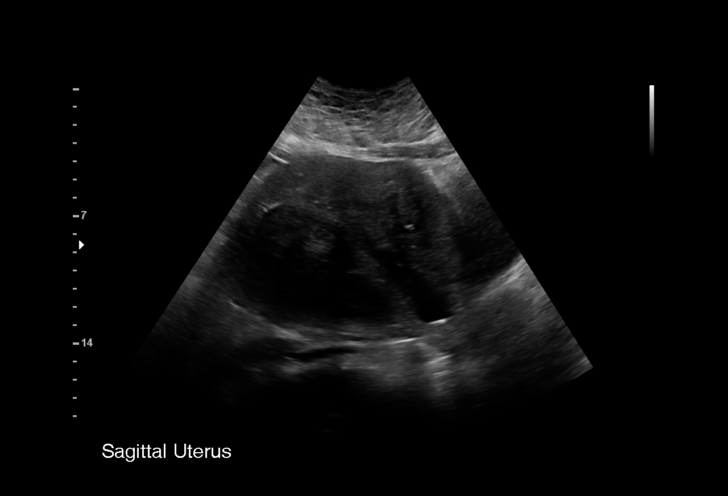
[im 5/57]
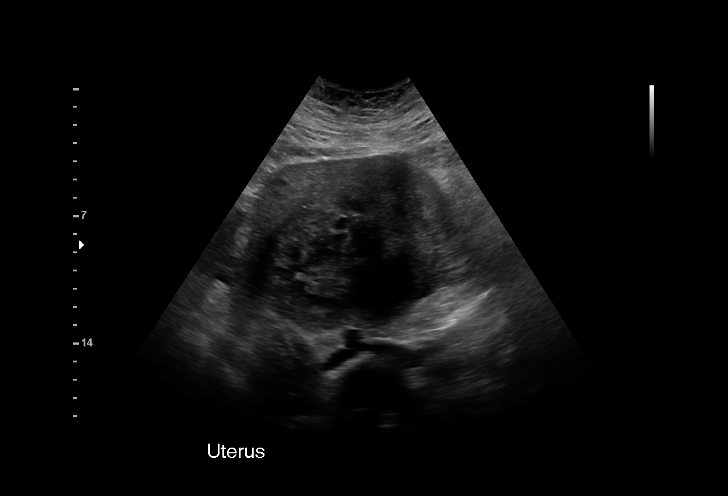
[im 10/57]
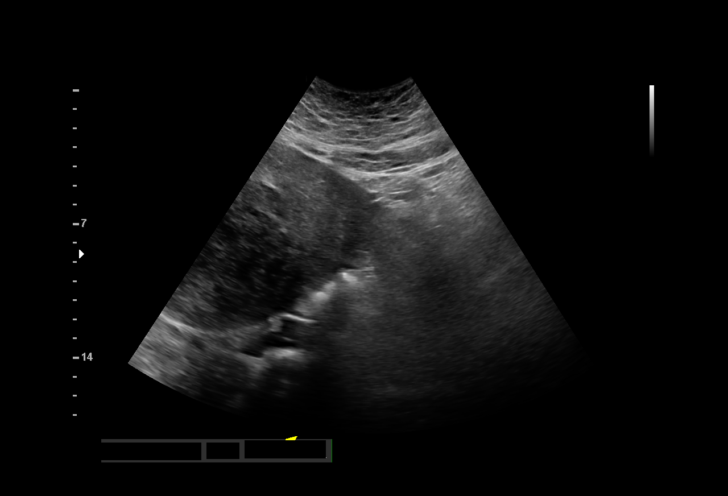
[im 15/57]
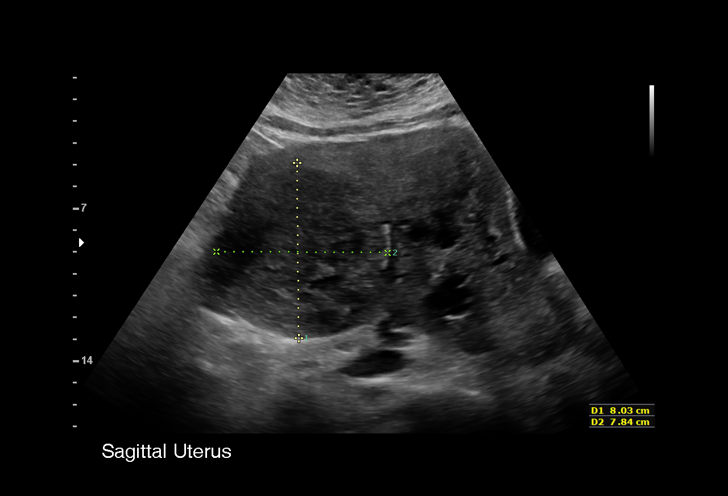
[im 19/57]
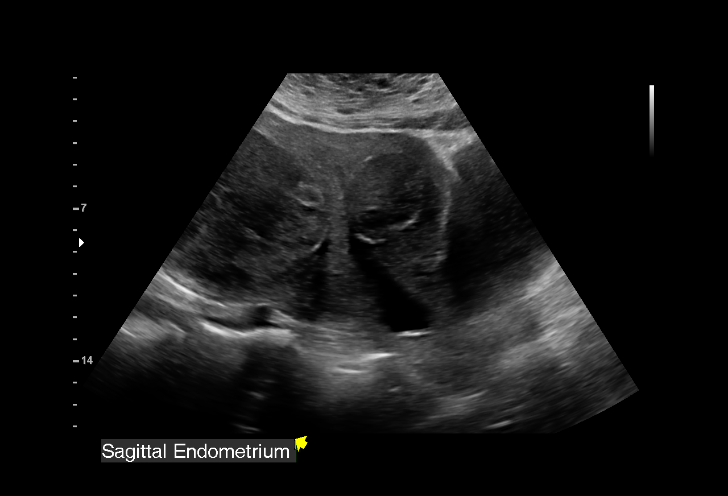
[im 22/57]
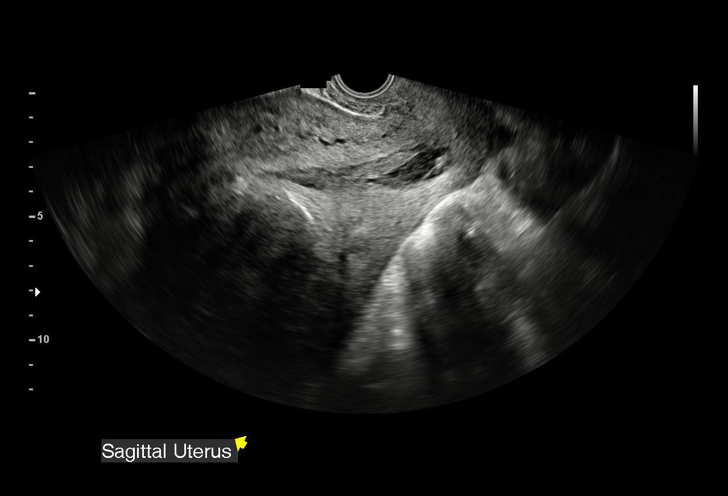
[im 26/57]
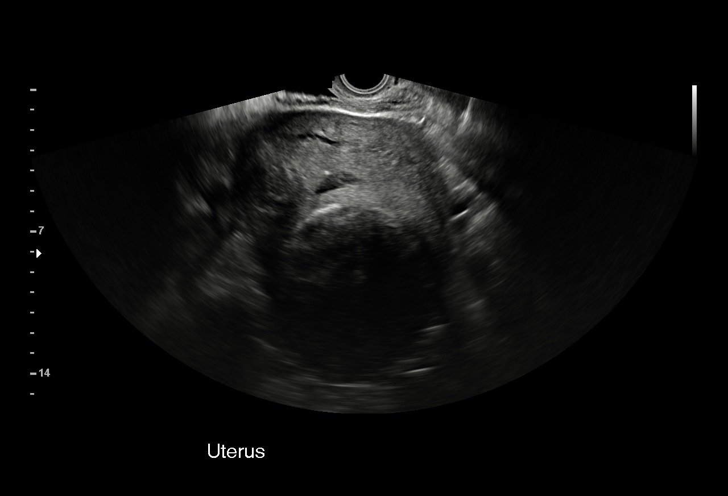
[im 31/57]
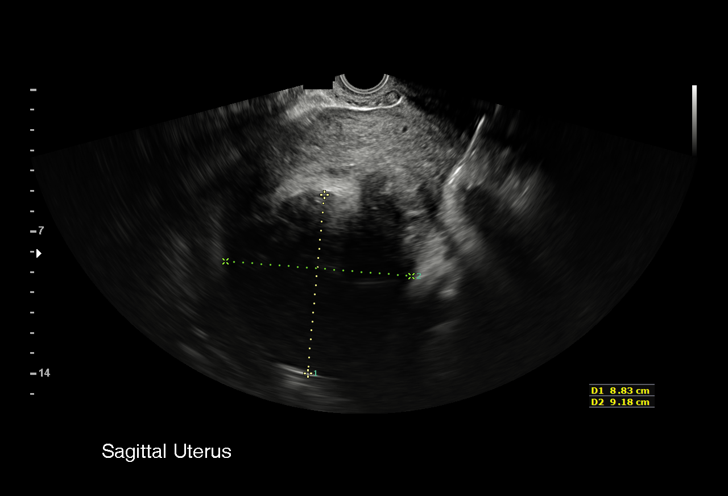
[im 36/57]
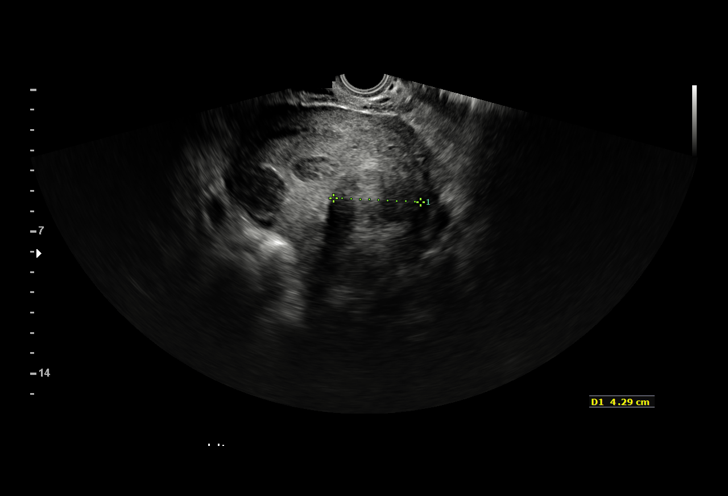
[im 38/57]
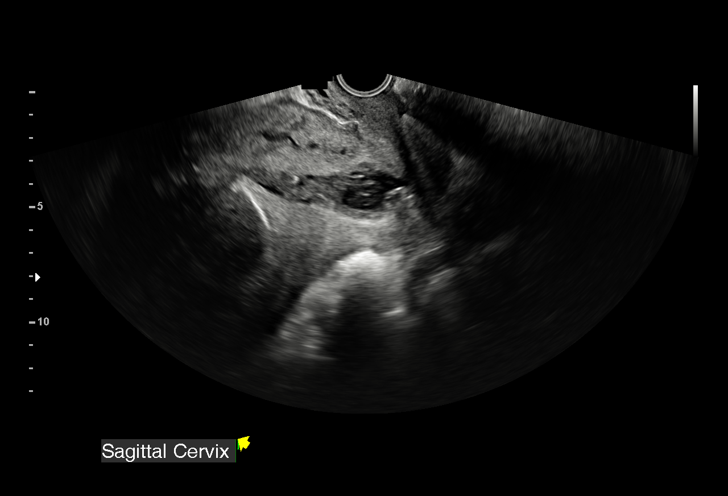
[im 43/57]
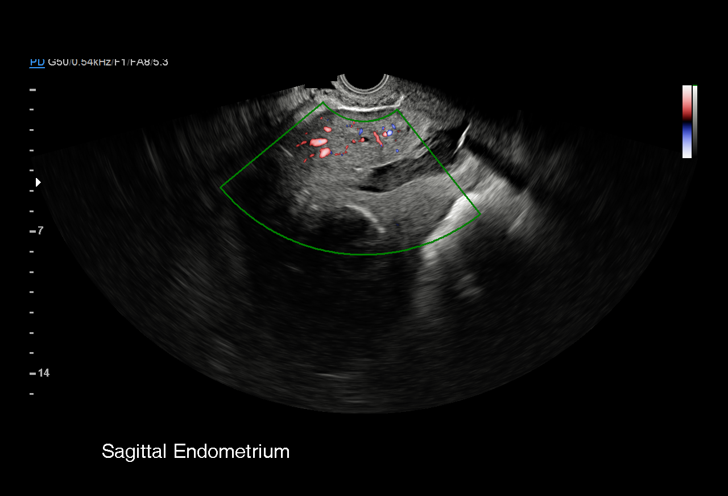
[im 47/57]
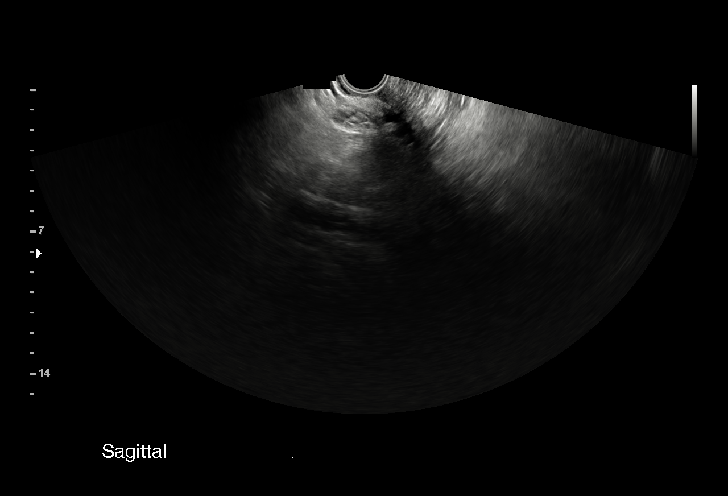
[im 52/57]
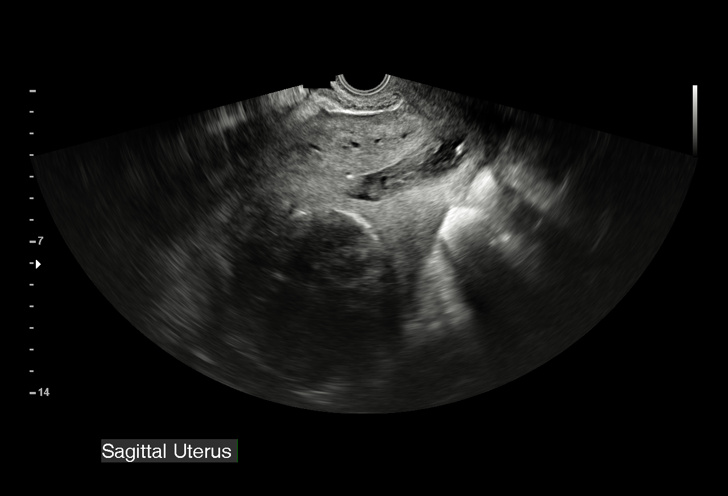
[im 57/57]
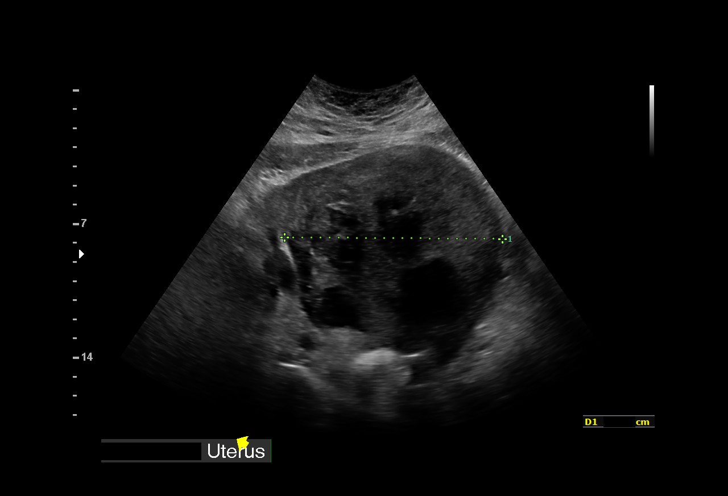

[14 of 25 positions shown; findings below may reference images not displayed]

FINDINGS: Uterus

Measurements: 14.4 x 9.7 x 11.4 cm. Anteverted uterus is enlarged by
multiple fibroids, with representative fibroids as follows:

- posterior fundal intramural 8.8 x 9.2 x 8.5 cm fibroid

- right anterior uterine body intramural 4.0 x 4.9 x 4.1 cm fibroid

- left posterior uterine body 3.7 x 3.3 x 4.3 cm fibroid with
approximately 30- 40% submucosal component

Endometrium

Endometrial visualization is limited by distortion from surrounding
fibroids. Estimated bilayer endometrial thickness 8 mm.
Heterogeneous endometrium with no focal endometrial mass or definite
gas demonstrated within the endometrial cavity. The endocervical
canal is distended up to 16 mm diameter by heterogeneous material
with no internal vascularity on color Doppler, most compatible with
blood products.

Right ovary

Measurements: 4.6 x 2.2 x 3.1 cm (transabdominal measurements).
Normal appearance/no adnexal mass.

Left ovary

Nonvisualization on transabdominal and transvaginal scan. No left
adnexal mass.

Other findings

No abnormal free fluid.
IMPRESSION: 1. Enlarged anteverted myomatous uterus.
2. Limited endometrial visualization due to distortion by
surrounding fibroids. Estimated bilayer endometrial thickness 8 mm,
within normal limits. Heterogeneous endometrium with no focal
endometrial mass or definite endometrial cavity gas.
3. Distension of the endocervical canal up to 16 mm diameter by
heterogeneous material with no internal vascularity, most compatible
with blood products.
4. Normal right ovary. Nonvisualization of the left ovary. No
adnexal masses.

## 2017-10-17 DIAGNOSIS — Z23 Encounter for immunization: Secondary | ICD-10-CM | POA: Diagnosis not present

## 2017-10-20 ENCOUNTER — Ambulatory Visit: Payer: 59 | Admitting: Podiatry

## 2017-10-22 ENCOUNTER — Ambulatory Visit (INDEPENDENT_AMBULATORY_CARE_PROVIDER_SITE_OTHER): Payer: 59 | Admitting: Podiatry

## 2017-10-22 ENCOUNTER — Encounter: Payer: Self-pay | Admitting: Podiatry

## 2017-10-22 DIAGNOSIS — L600 Ingrowing nail: Secondary | ICD-10-CM | POA: Diagnosis not present

## 2017-10-24 ENCOUNTER — Ambulatory Visit: Payer: 59 | Admitting: Podiatry

## 2017-10-26 NOTE — Progress Notes (Signed)
   Subjective: Patient presents today 2 weeks post ingrown nail permanent nail avulsion procedure of the medial and lateral borders of the left great toe. Patient states that the toe and nail fold is feeling much better.  Past Medical History:  Diagnosis Date  . Allergic rhinitis   . Anxiety   . Chronic hypertension affecting pregnancy 08/25/2017  . Complication of anesthesia    disorriented and chills when woke up  . Fibroid   . HPV test positive     Objective: Skin is warm, dry and supple. Nail and respective nail fold appears to be healing appropriately. Open wound to the associated nail fold with a granular wound base and moderate amount of fibrotic tissue. Minimal drainage noted. Mild erythema around the periungual region likely due to phenol chemical matricectomy.  Assessment: #1 postop permanent partial nail avulsion medial and lateral borders left great toe #2 open wound periungual nail fold of respective digit.   Plan of care: #1 patient was evaluated  #2 debridement of open wound was performed to the periungual border of the respective toe using a currette. Antibiotic ointment and Band-Aid was applied. #3 patient is to return to clinic on a PRN  basis.   Edrick Kins, DPM [facility]  Dr. Edrick Kins, [address]                                        [place], [place] [address]                Office [phone]  Fax [phone]

## 2017-11-13 ENCOUNTER — Encounter: Payer: Self-pay | Admitting: Podiatry

## 2017-11-13 ENCOUNTER — Ambulatory Visit (INDEPENDENT_AMBULATORY_CARE_PROVIDER_SITE_OTHER): Payer: 59 | Admitting: Podiatry

## 2017-11-13 DIAGNOSIS — L600 Ingrowing nail: Secondary | ICD-10-CM | POA: Diagnosis not present

## 2017-11-13 MED ORDER — CEPHALEXIN 500 MG PO CAPS: 500.0000 mg | ORAL_CAPSULE | Freq: Three times a day (TID) | ORAL | 2 refills | Status: DC

## 2017-11-13 NOTE — Patient Instructions (Signed)

## 2017-11-14 NOTE — Progress Notes (Signed)
Subjective: Beverly Tate presents the office today for concerns of drainage coming from her left big toenail which started on Tuesday however last night it started become painful.  She appears to have a partial nail avulsion performed by Dr. Amalia Hailey last month and she states that she is doing well up until this week.  She denies seeing any redness or any red streaks in the area is only painful with pressure in shoes.  She has no other concerns today. Denies any systemic complaints such as fevers, chills, nausea, vomiting. No acute changes since last appointment, and no other complaints at this time.   Objective: AAO x3, NAD DP/PT pulses palpable bilaterally, CRT less than 3 seconds The left hallux toenail appears to be dystrophic, discolored yellow-brown discoloration distally and there is increase in both the medial lateral aspect of the nail and overall the nail overall general curved appearance.  There is mild hyper granulation tissue on the proximal lateral aspect the left hallux toenail with a small amount of clear drainage expressed there is no pus.  There is no surrounding erythema, ascending cellulitis.  There is no fluctuance or crepitus.  There is no malodor. No open lesions or pre-ulcerative lesions.  No pain with calf compression, swelling, warmth, erythema  Assessment: Recurrent ingrown toenail left lateral hallux  Plan: -All treatment options discussed with the patient including all alternatives, risks, complications.  -I discussed a repeat partial nail avulsion but she wishes to hold off on this today.  We will start antibiotics and prescribed Keflex.  Recommended Epsom salt soaks daily as well as antibiotic ointment and a bandage.  I applied a small amount of silver nitrate to the hyper granulation tissue today. -She has an appointment next week with Dr. Amalia Hailey no hyperacute that appointment.  If symptoms not improved she is continued to have a partial nail avulsion performed at that  point. -Monitor for any clinical signs or symptoms of infection and directed to call the office immediately should any occur or go to the ER. -Patient encouraged to call the office with any questions, concerns, change in symptoms.   Trula Slade DPM

## 2017-11-17 ENCOUNTER — Ambulatory Visit: Payer: 59 | Admitting: Podiatry

## 2017-11-19 ENCOUNTER — Ambulatory Visit: Payer: 59 | Admitting: Podiatry

## 2017-11-20 ENCOUNTER — Ambulatory Visit: Payer: 59 | Admitting: Podiatry

## 2017-11-20 ENCOUNTER — Encounter: Payer: Self-pay | Admitting: Podiatry

## 2017-11-20 ENCOUNTER — Ambulatory Visit (INDEPENDENT_AMBULATORY_CARE_PROVIDER_SITE_OTHER): Payer: 59 | Admitting: Podiatry

## 2017-11-20 DIAGNOSIS — L600 Ingrowing nail: Secondary | ICD-10-CM | POA: Diagnosis not present

## 2017-11-20 NOTE — Patient Instructions (Addendum)
Place 1/4 cup of epsom salts in a quart of warm tap water.  Submerge your foot or feet in the solution and soak for 20 minutes.  This soak should be done twice a day.  Next, remove your foot or feet from solution, blot dry the affected area. Apply ointment and cover if instructed by your doctor.   IF YOUR SKIN BECOMES IRRITATED WHILE USING THESE INSTRUCTIONS, IT IS OKAY TO SWITCH TO  WHITE VINEGAR AND WATER.  As another alternative soak, you may use antibacterial soap and water.  Monitor for any signs/symptoms of infection. Call the office immediately if any occur or go directly to the emergency room. Call with any questions/concerns.  ---------------------  Elizebeth Koller your antibiotics  If you have any questions, please give [place] a call at [phone]. Hope you have a great weekend!!

## 2017-11-21 ENCOUNTER — Telehealth: Payer: Self-pay | Admitting: Podiatry

## 2017-11-21 NOTE — Telephone Encounter (Signed)
Attempted to call patient to see how she was doing after her procedure yesterday. Left voicemail to call with any questions or concerns.

## 2017-11-24 DIAGNOSIS — L600 Ingrowing nail: Secondary | ICD-10-CM | POA: Insufficient documentation

## 2017-11-24 NOTE — Progress Notes (Addendum)
Subjective: Beverly Tate presents the office today for follow-up evaluation of ingrown toenail of the left big toe.  She states that she does not have any pain to the area currently but it still does not look good.  She denies any pus but there is been a small amount of clear drainage.  She has no other concerns today.  She has been taking antibiotics as directed.   Denies any systemic complaints such as fevers, chills, nausea, vomiting. No acute changes since last appointment, and no other complaints at this time.   Objective: AAO x3, NAD DP/PT pulses palpable bilaterally, CRT less than 3 seconds On the lateral aspect of the left hallux toenail and the proximal nail border there is still hyper granulation tissue present on the area and small amount of clear drainage is expressed.  There is no pus.  There does appear to be localized edema directly along this area but there is no significant erythema, ascending cellulitis.  There is no fluctuance or crepitus.  There is no malodor.  No open lesions or pre-ulcerative lesions.  No pain with calf compression, swelling, warmth, erythema  Assessment: Continued symptoms left lateral hallux although decreased pain  Plan: -All treatment options discussed with the patient including all alternatives, risks, complications.  -Given that there is still hyperventilation tissue present a small amount of clear drainage coming from the area I do think that we need to repeat the procedure to make sure there is no residual toenail or skin buildup in the area which is causing the hyper granulation.  There is still localized edema as well.  She understands this and she wishes to proceed with the suture. -At this time, the patient is requesting partial nail removal with chemical matricectomy to the symptomatic portion of the nail. Risks and complications were discussed with the patient for which they understand and written consent was obtained. Under sterile conditions a total of  3 mL of a mixture of 2% lidocaine plain and 0.5% Marcaine plain was infiltrated in a hallux block fashion. Once anesthetized, the skin was prepped in sterile fashion. A tourniquet was then applied. Next the lateral aspect of hallux nail border was then sharply excised making sure to remove the entire offending nail border. Once the nails were ensured to be removed area was debrided and the underlying skin was intact. There is no purulence identified in the procedure. Next phenol was then applied under standard conditions and copiously irrigated. Silvadene was applied. A dry sterile dressing was applied. After application of the dressing the tourniquet was removed and there is found to be an immediate capillary refill time to the digit. The patient tolerated the procedure well any complications. Post procedure instructions were discussed the patient for which he verbally understood. Follow-up in one week for nail check or sooner if any problems are to arise. Discussed signs/symptoms of infection and directed to call the office immediately should any occur or go directly to the emergency room. In the meantime, encouraged to call the office with any questions, concerns, changes symptoms.  *X-ray left foot next appointment.  Elevate hallux and lateral view please  Trula Slade DPM

## 2017-11-27 ENCOUNTER — Ambulatory Visit (INDEPENDENT_AMBULATORY_CARE_PROVIDER_SITE_OTHER): Payer: 59

## 2017-11-27 ENCOUNTER — Ambulatory Visit (INDEPENDENT_AMBULATORY_CARE_PROVIDER_SITE_OTHER): Payer: 59 | Admitting: Podiatry

## 2017-11-27 ENCOUNTER — Encounter: Payer: Self-pay | Admitting: Podiatry

## 2017-11-27 DIAGNOSIS — M7750 Other enthesopathy of unspecified foot: Secondary | ICD-10-CM | POA: Diagnosis not present

## 2017-11-27 DIAGNOSIS — L600 Ingrowing nail: Secondary | ICD-10-CM | POA: Diagnosis not present

## 2017-11-27 NOTE — Patient Instructions (Signed)

## 2017-11-27 NOTE — Progress Notes (Signed)
Subjective: Beverly Tate is a 36 y.o.  female returns to office today for follow up evaluation after having left hallux  nail avulsion performed. Patient has been soaking using epsom salts and applying topical antibiotic covered with bandaid daily. Patient denies fevers, chills, nausea, vomiting. Denies any calf pain, chest pain, SOB.   Objective:  Vitals: Reviewed  General: Well developed, nourished, in no acute distress, alert and oriented x3   Dermatology: Skin is warm, dry and supple bilateral. Left hallux nail border appears to be clean, dry, with mild granular tissue and surrounding scab. There is no surrounding erythema, edema, drainage/purulence. The remaining nails appear unremarkable at this time. There are no other lesions or other signs of infection present. No clinical signs of infection noted today.   Neurovascular status: Intact. No lower extremity swelling; No pain with calf compression bilateral.  Musculoskeletal: Minimal tenderness to palpation of the lateral hallux nail fold. Muscular strength within normal limits bilateral.   Assesement and Plan: S/p partial nail avulsion, doing well.   -Continue soaking in epsom salts twice a day followed by antibiotic ointment and a band-aid. Can leave uncovered at night. Continue this until completely healed.  -X-rays were obtained and reviewed with the patient. Small dorsal spur off the distal phalanx but no evidence of acute fracture or osteomyelitis.  -If the area has not healed in 2 weeks, call the office for follow-up appointment, or sooner if any problems arise.  -Monitor for any signs/symptoms of infection. Call the office immediately if any occur or go directly to the emergency room. Call with any questions/concerns.  Celesta Gentile, DPM

## 2017-12-01 ENCOUNTER — Telehealth: Payer: Self-pay | Admitting: Podiatry

## 2017-12-01 ENCOUNTER — Telehealth: Payer: Self-pay | Admitting: *Deleted

## 2017-12-01 MED ORDER — FLUCONAZOLE 150 MG PO TABS: 150.0000 mg | ORAL_TABLET | Freq: Once | ORAL | 0 refills | Status: AC

## 2017-12-01 NOTE — Telephone Encounter (Signed)
I spoke with pt and ordered Difulcan as prescribed by Dr. Jacqualyn Posey.

## 2017-12-01 NOTE — Telephone Encounter (Signed)
If a nurse could call me back at [phone] I would greatly appreciate it. Thanks so much.

## 2017-12-01 NOTE — Telephone Encounter (Signed)
Pt states the antibiotic prescribed by Dr. Jacqualyn Posey has given her a yeast infection. Dr. Jacqualyn Posey ordered Difulcan 150mg  #1 take as directed.

## 2017-12-23 ENCOUNTER — Other Ambulatory Visit: Payer: Self-pay | Admitting: Obstetrics and Gynecology

## 2017-12-23 ENCOUNTER — Other Ambulatory Visit (HOSPITAL_COMMUNITY)
Admission: RE | Admit: 2017-12-23 | Discharge: 2017-12-23 | Disposition: A | Discharge status: Home / Self Care | Payer: 59 | Source: Ambulatory Visit | Attending: Obstetrics and Gynecology | Admitting: Obstetrics and Gynecology

## 2017-12-23 DIAGNOSIS — Z01419 Encounter for gynecological examination (general) (routine) without abnormal findings: Secondary | ICD-10-CM | POA: Diagnosis not present

## 2017-12-23 DIAGNOSIS — D219 Benign neoplasm of connective and other soft tissue, unspecified: Secondary | ICD-10-CM | POA: Diagnosis not present

## 2017-12-23 DIAGNOSIS — N760 Acute vaginitis: Secondary | ICD-10-CM | POA: Diagnosis not present

## 2017-12-23 DIAGNOSIS — R102 Pelvic and perineal pain: Secondary | ICD-10-CM | POA: Diagnosis not present

## 2017-12-24 LAB — CYTOLOGY - PAP: DIAGNOSIS: NEGATIVE

## 2018-01-08 DIAGNOSIS — O10913 Unspecified pre-existing hypertension complicating pregnancy, third trimester: Secondary | ICD-10-CM | POA: Diagnosis not present

## 2018-01-08 DIAGNOSIS — R102 Pelvic and perineal pain: Secondary | ICD-10-CM | POA: Diagnosis not present

## 2018-01-08 DIAGNOSIS — D251 Intramural leiomyoma of uterus: Secondary | ICD-10-CM | POA: Diagnosis not present

## 2018-01-16 DIAGNOSIS — R0981 Nasal congestion: Secondary | ICD-10-CM | POA: Diagnosis not present

## 2018-01-16 DIAGNOSIS — J0110 Acute frontal sinusitis, unspecified: Secondary | ICD-10-CM | POA: Diagnosis not present

## 2018-01-30 DIAGNOSIS — M5412 Radiculopathy, cervical region: Secondary | ICD-10-CM | POA: Diagnosis not present

## 2018-01-30 DIAGNOSIS — M25512 Pain in left shoulder: Secondary | ICD-10-CM | POA: Diagnosis not present

## 2018-02-04 DIAGNOSIS — M542 Cervicalgia: Secondary | ICD-10-CM | POA: Diagnosis not present

## 2018-02-04 DIAGNOSIS — M5412 Radiculopathy, cervical region: Secondary | ICD-10-CM | POA: Diagnosis not present

## 2018-02-04 DIAGNOSIS — M25512 Pain in left shoulder: Secondary | ICD-10-CM | POA: Diagnosis not present

## 2018-02-13 DIAGNOSIS — M5412 Radiculopathy, cervical region: Secondary | ICD-10-CM | POA: Diagnosis not present

## 2018-02-13 DIAGNOSIS — M542 Cervicalgia: Secondary | ICD-10-CM | POA: Diagnosis not present

## 2018-02-13 DIAGNOSIS — M25512 Pain in left shoulder: Secondary | ICD-10-CM | POA: Diagnosis not present

## 2018-03-03 DIAGNOSIS — M7989 Other specified soft tissue disorders: Secondary | ICD-10-CM | POA: Diagnosis not present

## 2018-03-27 DIAGNOSIS — M1732 Unilateral post-traumatic osteoarthritis, left knee: Secondary | ICD-10-CM | POA: Diagnosis not present

## 2018-04-15 DIAGNOSIS — M79662 Pain in left lower leg: Secondary | ICD-10-CM | POA: Diagnosis not present

## 2018-04-15 DIAGNOSIS — R600 Localized edema: Secondary | ICD-10-CM | POA: Diagnosis not present

## 2018-04-15 DIAGNOSIS — M79661 Pain in right lower leg: Secondary | ICD-10-CM | POA: Diagnosis not present

## 2018-04-17 ENCOUNTER — Ambulatory Visit (INDEPENDENT_AMBULATORY_CARE_PROVIDER_SITE_OTHER): Payer: 59

## 2018-04-17 ENCOUNTER — Ambulatory Visit: Payer: 59 | Admitting: Podiatry

## 2018-04-17 ENCOUNTER — Ambulatory Visit: Payer: 59

## 2018-04-17 DIAGNOSIS — M79671 Pain in right foot: Secondary | ICD-10-CM | POA: Diagnosis not present

## 2018-04-17 DIAGNOSIS — M216X9 Other acquired deformities of unspecified foot: Secondary | ICD-10-CM | POA: Diagnosis not present

## 2018-04-17 DIAGNOSIS — M7750 Other enthesopathy of unspecified foot: Secondary | ICD-10-CM | POA: Diagnosis not present

## 2018-04-17 DIAGNOSIS — M779 Enthesopathy, unspecified: Secondary | ICD-10-CM

## 2018-04-17 MED ORDER — MELOXICAM 15 MG PO TABS: 15.0000 mg | ORAL_TABLET | Freq: Every day | ORAL | 0 refills | Status: AC

## 2018-04-17 NOTE — Progress Notes (Signed)
Subjective: Beverly Tate presents the office with concerns of chronic right foot pain which is been ongoing for several years.  She states that she has changed her shoes and this is been getting better but she complains of pain to the top of her foot when wearing certain shoes and walking and standing at work.  She denies any recent injury or trauma.  She has a remote history of an injury she had some swelling to the outside aspect of her ankle however this is not new.  She denies any redness or warmth.  She states the toenail sites doing well having no issues. Denies any systemic complaints such as fevers, chills, nausea, vomiting. No acute changes since last appointment, and no other complaints at this time.   Objective: AAO x3, NAD DP/PT pulses palpable bilaterally, CRT less than 3 seconds There is tenderness palpation along the lateral aspect of the foot and ankle.  There is mild tenderness on the ATFL but there is no gross ankle instability present.  Anterior drawer and talar tilt test is negative.  Mild discomfort on the lateral aspect of foot on the sinus tarsi as well.  There is no area pinpoint bony tenderness or pain to vibratory sensation.  There is minimal discomfort on the course of the peroneal tendon but the peroneal tendon appears to be intact.  Achilles tendon intact.  No other area of tenderness.  Supination is present upon weightbearing gait evaluation No open lesions or pre-ulcerative lesions.  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  Plan: -All treatment options discussed with the patient including all alternatives, risks, complications.  -X-rays were obtained and reviewed.  There is no definitive evidence of acute fracture or stress fracture. -Given this is a chronic issue I think this is more related to her foot type, shoes, and activity level.  I do think she benefit from  orthotic.  She wears dress shoes and flatten the day mostly.  We will try to make her dress orthotic.  She was molded today for the orthotics. -Prescribed mobic. Discussed side effects of the medication and directed to stop if any are to occur and call the office.  -Stretching exercises for the tendinitis. -Discussed steroid injection but given the longevity of her symptoms as well as the diffuse tenderness to restart with this.  We will consider steroid injection of the sinus tarsi symptoms continue. -RTC 3 weeks to PUO or sooner if needed -Patient encouraged to call the office with any questions, concerns, change in symptoms.   Trula Slade DPM

## 2018-04-22 ENCOUNTER — Telehealth: Payer: Self-pay | Admitting: Podiatry

## 2018-04-22 NOTE — Telephone Encounter (Signed)
Pt returned my call and is aware of coverage and would like to go ahead and proceed with ordering the orthotics.

## 2018-04-22 NOTE — Telephone Encounter (Signed)
lvm for pt to call me back to discuss orthotic benefits.

## 2018-05-08 ENCOUNTER — Ambulatory Visit (INDEPENDENT_AMBULATORY_CARE_PROVIDER_SITE_OTHER): Payer: 59 | Admitting: Podiatry

## 2018-05-08 DIAGNOSIS — M779 Enthesopathy, unspecified: Secondary | ICD-10-CM

## 2018-05-12 NOTE — Progress Notes (Signed)
Patient presents to pick up orthotics however they have not yet arrived.  Due to that  she did not need to be seen she states.  Will call once orthotics come in for her to call sooner if she is having any issues.  Trula Slade DPM

## 2018-08-05 DIAGNOSIS — R102 Pelvic and perineal pain: Secondary | ICD-10-CM | POA: Diagnosis not present

## 2018-08-05 DIAGNOSIS — D251 Intramural leiomyoma of uterus: Secondary | ICD-10-CM | POA: Diagnosis not present

## 2018-10-05 DIAGNOSIS — Z23 Encounter for immunization: Secondary | ICD-10-CM | POA: Diagnosis not present

## 2018-10-05 DIAGNOSIS — Z0000 Encounter for general adult medical examination without abnormal findings: Secondary | ICD-10-CM | POA: Diagnosis not present

## 2018-10-05 DIAGNOSIS — O10913 Unspecified pre-existing hypertension complicating pregnancy, third trimester: Secondary | ICD-10-CM | POA: Diagnosis not present

## 2019-07-15 ENCOUNTER — Ambulatory Visit (INDEPENDENT_AMBULATORY_CARE_PROVIDER_SITE_OTHER): Payer: 59

## 2019-07-15 ENCOUNTER — Other Ambulatory Visit: Payer: Self-pay | Admitting: *Deleted

## 2019-07-15 ENCOUNTER — Encounter: Payer: Self-pay | Admitting: Podiatry

## 2019-07-15 ENCOUNTER — Ambulatory Visit (INDEPENDENT_AMBULATORY_CARE_PROVIDER_SITE_OTHER): Payer: 59 | Admitting: Podiatry

## 2019-07-15 ENCOUNTER — Other Ambulatory Visit: Payer: Self-pay

## 2019-07-15 DIAGNOSIS — M779 Enthesopathy, unspecified: Secondary | ICD-10-CM

## 2019-07-15 DIAGNOSIS — M722 Plantar fascial fibromatosis: Secondary | ICD-10-CM

## 2019-07-15 DIAGNOSIS — M7751 Other enthesopathy of right foot: Secondary | ICD-10-CM | POA: Diagnosis not present

## 2019-07-15 MED ORDER — MELOXICAM 15 MG PO TABS: 15.0000 mg | ORAL_TABLET | Freq: Every day | ORAL | 0 refills | Status: AC

## 2019-07-15 NOTE — Progress Notes (Signed)
Subjective: 38 year old female presents the office today for concerns of pain to the right foot.  This been on the for the 1 month.  She states it started after she walked up 5 miles with her child.  The next day she started have discomfort and is been hurting since then.  She has not worn orthotics when she was walking.  No recent injury otherwise. Denies any systemic complaints such as fevers, chills, nausea, vomiting. No acute changes since last appointment, and no other complaints at this time.   Objective: AAO x3, NAD DP/PT pulses palpable bilaterally, CRT less than 3 seconds There is tenderness palpation to the lateral aspect the foot most along sinus tarsi and also to the fifth metatarsal base insertion the peroneal tendon.  Subjectively she getting some discomfort along the extensor tendons when she bends her foot in dorsiflexion but all the tendons appear to be intact.  There is no erythema.  She has chronic swelling to the ankle from an old injury.  No pain to the plantar aspect of Achilles tendon. No pain with calf compression, swelling, warmth, erythema  Assessment: Subtalar joint capsulitis, tendinitis right  Plan: -All treatment options discussed with the patient including all alternatives, risks, complications.  -Steroid injection performed to the right foot sinus tarsi without complications.  See injection note below. -Prescribed mobic. Discussed side effects of the medication and directed to stop if any are to occur and call the office. Hold other anti-inflammatories -Ice to the area daily -Trilock ankle brace  Procedure: Injection Intermediate Joint Discussed alternatives, risks, complications and verbal consent was obtained.  Location: Right sinus tarsi  Skin Prep: Betadine  Injectate: 0.5cc 0.5% marcaine plain, 0.5 cc 2% lidocaine plain and, 1 cc kenalog 10. Disposition: Patient tolerated procedure well. Injection site dressed with a band-aid.  Post-injection care was  discussed and return precautions discussed.   Trula Slade DPM  No follow-ups on file.  Trula Slade DPM   -Patient encouraged to call the office with any questions, concerns, change in symptoms.

## 2019-07-16 ENCOUNTER — Other Ambulatory Visit: Payer: Self-pay | Admitting: Podiatry

## 2019-07-16 DIAGNOSIS — M779 Enthesopathy, unspecified: Secondary | ICD-10-CM

## 2020-11-02 DIAGNOSIS — D2271 Melanocytic nevi of right lower limb, including hip: Secondary | ICD-10-CM | POA: Insufficient documentation

## 2020-11-02 DIAGNOSIS — L700 Acne vulgaris: Secondary | ICD-10-CM | POA: Insufficient documentation

## 2021-05-26 NOTE — Telephone Encounter (Signed)
Error

## 2021-07-03 ENCOUNTER — Encounter: Payer: Self-pay | Admitting: Podiatry

## 2021-07-03 ENCOUNTER — Ambulatory Visit (INDEPENDENT_AMBULATORY_CARE_PROVIDER_SITE_OTHER): Payer: 59

## 2021-07-03 ENCOUNTER — Other Ambulatory Visit: Payer: Self-pay

## 2021-07-03 ENCOUNTER — Ambulatory Visit: Payer: 59 | Admitting: Podiatry

## 2021-07-03 DIAGNOSIS — M779 Enthesopathy, unspecified: Secondary | ICD-10-CM

## 2021-07-03 MED ORDER — TRIAMCINOLONE ACETONIDE 10 MG/ML IJ SUSP
10.0000 mg | Freq: Once | INTRAMUSCULAR | Status: AC
  Administered 2021-07-03: 10 mg

## 2021-07-03 NOTE — Patient Instructions (Signed)

## 2021-07-07 NOTE — Progress Notes (Signed)
Subjective: 40 year old female presents the office today for concerns of recurrent pain to the right foot.  She states after I saw her last in 2020 after the injection she had no pain.  Has started come back over the last month.  No recent injury or trauma any changes. Denies any systemic complaints such as fevers, chills, nausea, vomiting. No acute changes since last appointment, and no other complaints at this time.   Objective: AAO x3, NAD DP/PT pulses palpable bilaterally, CRT less than 3 seconds There is tenderness palpation to the lateral aspect the foot most along sinus tarsi.  Trace edema.  There is no erythema or warmth. Ankle, subtalar range of motion intact.  MMT 5/5. No pain with calf compression, swelling, warmth, erythema  Assessment: Subtalar joint capsulitis,  Plan: -All treatment options discussed with the patient including all alternatives, risks, complications.  -Steroid injection performed to the right foot sinus tarsi without complications.  See injection note below. -Anti-inflammatories as needed -Discussed supportive shoe gear.  Procedure: Injection Intermediate Joint Discussed alternatives, risks, complications and verbal consent was obtained.  Location: Right sinus tarsi  Skin Prep: Betadine  Injectate: 0.5cc 0.5% marcaine plain, 0.5 cc 2% lidocaine plain and, 1 cc kenalog 10. Disposition: Patient tolerated procedure well. Injection site dressed with a band-aid.  Post-injection care was discussed and return precautions discussed.   Trula Slade DPM

## 2021-09-17 ENCOUNTER — Encounter: Payer: Self-pay | Admitting: Podiatry

## 2022-12-24 ENCOUNTER — Ambulatory Visit: Payer: Self-pay | Admitting: Podiatry

## 2022-12-25 NOTE — Progress Notes (Deleted)
42 y.o. G11P1001 Married [place] American female here for NEW GYN/annual exam.    PCP:     No LMP recorded.           Sexually active: {yes no:314532}  The current method of family planning is {contraception:315051}.    Exercising: {yes no:314532}  {types:19826} Smoker:  {YES [id number]  Health Maintenance: Pap:  12/23/17 neg, 12/30/16 neg History of abnormal Pap:  {YES NO:22349} MMG:  n/a Colonoscopy:  n/a BMD:   n/a  Result  n/a TDaP:  12/16/2006 Gardasil:   no HIV: Hep C: Screening Labs:  Hb today: ***, Urine today: ***   reports that she has quit smoking. Her smoking use included cigarettes. She has a 5.00 pack-year smoking history. She has never used smokeless tobacco. She reports current alcohol use of about 1.0 - 2.0 standard drink of alcohol per week. She reports that she does not use drugs.  Past Medical History:  Diagnosis Date   Allergic rhinitis    Anxiety    Chronic hypertension affecting pregnancy 123XX123   Complication of anesthesia    disorriented and chills when woke up   Fibroid    HPV test positive     Past Surgical History:  Procedure Laterality Date   KNEE SURGERY Left 07/2002   ACL repair    Current Outpatient Medications  Medication Sig Dispense Refill   azelastine (ASTELIN) 0.1 % nasal spray      fluticasone (FLONASE) 50 MCG/ACT nasal spray Place 1 spray into both nostrils daily.     fluticasone (FLONASE) 50 MCG/ACT nasal spray Place into the nose.     ibuprofen (ADVIL) 800 MG tablet Take 800 mg by mouth every 8 (eight) hours.     ibuprofen (ADVIL,MOTRIN) 600 MG tablet Take 1 tablet (600 mg total) by mouth every 6 (six) hours as needed. (Patient not taking: Reported on 10/06/2017) 30 tablet 1   levocetirizine (XYZAL) 5 MG tablet      loratadine (CLARITIN) 10 MG tablet Take 10 mg by mouth daily as needed for allergies.     ofloxacin (OCUFLOX) 0.3 % ophthalmic solution Days 1-2: 1 drop every 2 hours while awake, up to 8 times daily. Days 3-7: 1 drop  4 times daily.     spironolactone (ALDACTONE) 50 MG tablet Take 1 tablet by mouth daily.     tazarotene (TAZORAC) 0.1 % gel Apply to face nightly to tolerance     No current facility-administered medications for this visit.    Family History  Problem Relation Age of Onset   Colon cancer Maternal Grandfather 2   Hypertension Mother    Thyroid disease Mother    Anemia Mother    Diabetes Mother    Hypertension Father    Hepatitis C Father    Varicose Veins Father    Diabetes Father    Diabetes Maternal Grandmother    Diabetes Paternal Grandmother    Anemia Sister    Sarcoidosis Sister     Review of Systems  Exam:   There were no vitals taken for this visit.    General appearance: alert, cooperative and appears stated age Head: normocephalic, without obvious abnormality, atraumatic Neck: no adenopathy, supple, symmetrical, trachea midline and thyroid normal to inspection and palpation Lungs: clear to auscultation bilaterally Breasts: normal appearance, no masses or tenderness, No nipple retraction or dimpling, No nipple discharge or bleeding, No axillary adenopathy Heart: regular rate and rhythm Abdomen: soft, non-tender; no masses, no organomegaly Extremities: extremities normal, atraumatic, no cyanosis  or edema Skin: skin color, texture, turgor normal. No rashes or lesions Lymph nodes: cervical, supraclavicular, and axillary nodes normal. Neurologic: grossly normal  Pelvic: External genitalia:  no lesions              No abnormal inguinal nodes palpated.              Urethra:  normal appearing urethra with no masses, tenderness or lesions              Bartholins and Skenes: normal                 Vagina: normal appearing vagina with normal color and discharge, no lesions              Cervix: no lesions              Pap taken: {yes no:314532} Bimanual Exam:  Uterus:  normal size, contour, position, consistency, mobility, non-tender              Adnexa: no mass, fullness,  tenderness              Rectal exam: {yes no:314532}.  Confirms.              Anus:  normal sphincter tone, no lesions  Chaperone was present for exam:  ***  Assessment:   Well woman visit with gynecologic exam.   Plan: Mammogram screening discussed. Self breast awareness reviewed. Pap and HR HPV as above. Guidelines for Calcium, Vitamin D, regular exercise program including cardiovascular and weight bearing exercise.   Follow up annually and prn.   Additional counseling given.  {yes [id number]. _______ minutes face to face time of which over 50% was spent in counseling.    After visit summary provided.

## 2022-12-26 ENCOUNTER — Ambulatory Visit (INDEPENDENT_AMBULATORY_CARE_PROVIDER_SITE_OTHER): Payer: Commercial Managed Care - PPO

## 2022-12-26 ENCOUNTER — Ambulatory Visit (INDEPENDENT_AMBULATORY_CARE_PROVIDER_SITE_OTHER): Payer: Commercial Managed Care - PPO | Admitting: Podiatry

## 2022-12-26 DIAGNOSIS — M79671 Pain in right foot: Secondary | ICD-10-CM

## 2022-12-26 DIAGNOSIS — M779 Enthesopathy, unspecified: Secondary | ICD-10-CM | POA: Diagnosis not present

## 2022-12-26 DIAGNOSIS — Q666 Other congenital valgus deformities of feet: Secondary | ICD-10-CM

## 2022-12-26 MED ORDER — TRIAMCINOLONE ACETONIDE 10 MG/ML IJ SUSP
10.0000 mg | Freq: Once | INTRAMUSCULAR | Status: AC
  Administered 2022-12-26: 10 mg

## 2022-12-26 NOTE — Patient Instructions (Signed)

## 2022-12-28 NOTE — Progress Notes (Signed)
Subjective: Chief Complaint  Patient presents with   Foot Pain    Right foot, midfoot, any motion/stretching causes pain     42 year old female presents the office today for concerns of recurrent pain to the right foot.  She said the last injection did not last this long.  No recent injury or changes.  He is resting steroid injections today.  She typically wears flat shoes.  Objective: AAO x3, NAD DP/PT pulses palpable bilaterally, CRT less than 3 seconds There is tenderness palpation to the lateral aspect the foot most along sinus tarsi.  There is no significant edema.  There is no erythema.  Decreased medial arch height.  No pain with ankle or subtalar range of motion.  MMT 5/5.  No pain with calf compression, swelling, warmth, erythema  Assessment: Subtalar joint capsulitis, flatfoot  Plan: -All treatment options discussed with the patient including all alternatives, risks, complications.  -X-rays were obtained and reviewed with the patient.  Calcaneal spurring is present.  There is no evidence of acute fracture today. -Steroid injection performed to the right foot sinus tarsi without complications.  See injection note below. -Anti-inflammatories as needed -Discussed supportive shoe gear, change of shoes to help.  Procedure: Injection Intermediate Joint Discussed alternatives, risks, complications and verbal consent was obtained.  Location: Right sinus tarsi  Skin Prep: Betadine  Injectate: 0.5cc 0.5% marcaine plain, 0.5 cc 2% lidocaine plain and, 1 cc kenalog 10. Disposition: Patient tolerated procedure well. Injection site dressed with a band-aid.  Post-injection care was discussed and return precautions discussed.   Trula Slade DPM

## 2023-01-07 ENCOUNTER — Encounter: Payer: Commercial Managed Care - PPO | Admitting: Obstetrics and Gynecology

## 2023-01-21 ENCOUNTER — Ambulatory Visit: Payer: Self-pay | Admitting: Podiatry

## 2023-01-30 NOTE — Progress Notes (Deleted)
42 y.o. G61P1001 Married [place] American female here for NEW GYN/annual exam.    PCP:     No LMP recorded.           Sexually active: {yes no:314532}  The current method of family planning is {contraception:315051}.    Exercising: {yes no:314532}  {types:19826} Smoker:  former  Health Maintenance: Pap:  12/23/17 neg, 12/30/16 neg: HR HPV neg History of abnormal Pap:  {YES NO:22349} MMG:  n/a Colonoscopy:  n/a BMD:   n/a  Result  n/a TDaP:  12/16/06 Gardasil:   no HIV: Hep C: Screening Labs:  Hb today: ***, Urine today: ***   reports that she has quit smoking. Her smoking use included cigarettes. She has a 5.00 pack-year smoking history. She has never used smokeless tobacco. She reports current alcohol use of about 1.0 - 2.0 standard drink of alcohol per week. She reports that she does not use drugs.  Past Medical History:  Diagnosis Date   Allergic rhinitis    Anxiety    Chronic hypertension affecting pregnancy 123XX123   Complication of anesthesia    disorriented and chills when woke up   Fibroid    HPV test positive     Past Surgical History:  Procedure Laterality Date   KNEE SURGERY Left 07/2002   ACL repair    Current Outpatient Medications  Medication Sig Dispense Refill   azelastine (ASTELIN) 0.1 % nasal spray      fluticasone (FLONASE) 50 MCG/ACT nasal spray Place 1 spray into both nostrils daily.     fluticasone (FLONASE) 50 MCG/ACT nasal spray Place into the nose.     ibuprofen (ADVIL) 800 MG tablet Take 800 mg by mouth every 8 (eight) hours.     ibuprofen (ADVIL,MOTRIN) 600 MG tablet Take 1 tablet (600 mg total) by mouth every 6 (six) hours as needed. 30 tablet 1   levocetirizine (XYZAL) 5 MG tablet      loratadine (CLARITIN) 10 MG tablet Take 10 mg by mouth daily as needed for allergies.     ofloxacin (OCUFLOX) 0.3 % ophthalmic solution Days 1-2: 1 drop every 2 hours while awake, up to 8 times daily. Days 3-7: 1 drop 4 times daily.     spironolactone  (ALDACTONE) 50 MG tablet Take 1 tablet by mouth daily.     tazarotene (TAZORAC) 0.1 % gel Apply to face nightly to tolerance     No current facility-administered medications for this visit.    Family History  Problem Relation Age of Onset   Colon cancer Maternal Grandfather 24   Hypertension Mother    Thyroid disease Mother    Anemia Mother    Diabetes Mother    Hypertension Father    Hepatitis C Father    Varicose Veins Father    Diabetes Father    Diabetes Maternal Grandmother    Diabetes Paternal Grandmother    Anemia Sister    Sarcoidosis Sister     Review of Systems  Exam:   There were no vitals taken for this visit.    General appearance: alert, cooperative and appears stated age Head: normocephalic, without obvious abnormality, atraumatic Neck: no adenopathy, supple, symmetrical, trachea midline and thyroid normal to inspection and palpation Lungs: clear to auscultation bilaterally Breasts: normal appearance, no masses or tenderness, No nipple retraction or dimpling, No nipple discharge or bleeding, No axillary adenopathy Heart: regular rate and rhythm Abdomen: soft, non-tender; no masses, no organomegaly Extremities: extremities normal, atraumatic, no cyanosis or edema Skin: skin  color, texture, turgor normal. No rashes or lesions Lymph nodes: cervical, supraclavicular, and axillary nodes normal. Neurologic: grossly normal  Pelvic: External genitalia:  no lesions              No abnormal inguinal nodes palpated.              Urethra:  normal appearing urethra with no masses, tenderness or lesions              Bartholins and Skenes: normal                 Vagina: normal appearing vagina with normal color and discharge, no lesions              Cervix: no lesions              Pap taken: {yes no:314532} Bimanual Exam:  Uterus:  normal size, contour, position, consistency, mobility, non-tender              Adnexa: no mass, fullness, tenderness              Rectal  exam: {yes no:314532}.  Confirms.              Anus:  normal sphincter tone, no lesions  Chaperone was present for exam:  ***  Assessment:   Well woman visit with gynecologic exam.   Plan: Mammogram screening discussed. Self breast awareness reviewed. Pap and HR HPV as above. Guidelines for Calcium, Vitamin D, regular exercise program including cardiovascular and weight bearing exercise.   Follow up annually and prn.   Additional counseling given.  {yes [id number]. _______ minutes face to face time of which over 50% was spent in counseling.    After visit summary provided.

## 2023-02-13 ENCOUNTER — Other Ambulatory Visit: Payer: Self-pay | Admitting: Obstetrics and Gynecology

## 2023-02-13 ENCOUNTER — Encounter: Payer: Commercial Managed Care - PPO | Admitting: Obstetrics and Gynecology

## 2023-02-13 ENCOUNTER — Other Ambulatory Visit (HOSPITAL_COMMUNITY)
Admission: RE | Admit: 2023-02-13 | Discharge: 2023-02-13 | Disposition: A | Discharge status: Home / Self Care | Payer: Commercial Managed Care - PPO | Source: Ambulatory Visit | Attending: Obstetrics and Gynecology | Admitting: Obstetrics and Gynecology

## 2023-02-13 DIAGNOSIS — Z01419 Encounter for gynecological examination (general) (routine) without abnormal findings: Secondary | ICD-10-CM | POA: Insufficient documentation

## 2023-02-18 LAB — CYTOLOGY - PAP
Comment: NEGATIVE
Diagnosis: NEGATIVE
Diagnosis: REACTIVE
High risk HPV: NEGATIVE
